# Patient Record
Sex: Female | Born: 1985 | Race: Asian | Hispanic: No | Marital: Single | Smoking: Never smoker
Health system: Southern US, Community
[De-identification: ages and names within clinical notes are randomized; demographics above are authoritative.]

## PROBLEM LIST (undated history)

## (undated) ENCOUNTER — Inpatient Hospital Stay (HOSPITAL_COMMUNITY): Payer: Self-pay

---

## 2004-06-09 ENCOUNTER — Ambulatory Visit (HOSPITAL_COMMUNITY): Admission: RE | Admit: 2004-06-09 | Discharge: 2004-06-09 | Payer: Self-pay | Admitting: *Deleted

## 2004-07-12 ENCOUNTER — Ambulatory Visit: Payer: Self-pay | Admitting: Family Medicine

## 2004-07-12 ENCOUNTER — Ambulatory Visit: Payer: Self-pay | Admitting: Neonatology

## 2004-07-12 ENCOUNTER — Ambulatory Visit: Payer: Self-pay | Admitting: Obstetrics and Gynecology

## 2004-07-12 ENCOUNTER — Inpatient Hospital Stay (HOSPITAL_COMMUNITY): Admission: AD | Admit: 2004-07-12 | Discharge: 2004-07-23 | Payer: Self-pay | Admitting: Obstetrics & Gynecology

## 2004-07-12 ENCOUNTER — Ambulatory Visit: Payer: Self-pay | Admitting: Obstetrics & Gynecology

## 2004-07-19 ENCOUNTER — Encounter (INDEPENDENT_AMBULATORY_CARE_PROVIDER_SITE_OTHER): Payer: Self-pay | Admitting: Specialist

## 2004-07-24 ENCOUNTER — Encounter: Admission: RE | Admit: 2004-07-24 | Discharge: 2004-07-31 | Payer: Self-pay | Admitting: Obstetrics & Gynecology

## 2007-09-25 ENCOUNTER — Ambulatory Visit (HOSPITAL_COMMUNITY): Admission: RE | Admit: 2007-09-25 | Discharge: 2007-09-25 | Payer: Self-pay | Admitting: Family Medicine

## 2007-10-02 ENCOUNTER — Ambulatory Visit (HOSPITAL_COMMUNITY): Admission: RE | Admit: 2007-10-02 | Discharge: 2007-10-02 | Payer: Self-pay | Admitting: Family Medicine

## 2007-10-09 ENCOUNTER — Ambulatory Visit: Payer: Self-pay | Admitting: Family Medicine

## 2007-10-23 ENCOUNTER — Ambulatory Visit: Payer: Self-pay | Admitting: Obstetrics & Gynecology

## 2007-10-30 ENCOUNTER — Ambulatory Visit: Payer: Self-pay | Admitting: Obstetrics & Gynecology

## 2007-10-30 ENCOUNTER — Ambulatory Visit (HOSPITAL_COMMUNITY): Admission: RE | Admit: 2007-10-30 | Discharge: 2007-10-30 | Payer: Self-pay | Admitting: Family Medicine

## 2007-11-06 ENCOUNTER — Ambulatory Visit: Payer: Self-pay | Admitting: Family Medicine

## 2007-11-13 ENCOUNTER — Ambulatory Visit: Payer: Self-pay | Admitting: Family Medicine

## 2007-11-13 ENCOUNTER — Ambulatory Visit (HOSPITAL_COMMUNITY): Admission: RE | Admit: 2007-11-13 | Discharge: 2007-11-13 | Payer: Self-pay

## 2007-11-20 ENCOUNTER — Ambulatory Visit: Payer: Self-pay | Admitting: Family Medicine

## 2007-11-27 ENCOUNTER — Ambulatory Visit: Payer: Self-pay | Admitting: Family Medicine

## 2007-11-28 ENCOUNTER — Ambulatory Visit (HOSPITAL_COMMUNITY): Admission: RE | Admit: 2007-11-28 | Discharge: 2007-11-28 | Payer: Self-pay | Admitting: Family Medicine

## 2007-12-04 ENCOUNTER — Ambulatory Visit: Payer: Self-pay | Admitting: *Deleted

## 2007-12-11 ENCOUNTER — Ambulatory Visit (HOSPITAL_COMMUNITY): Admission: RE | Admit: 2007-12-11 | Discharge: 2007-12-11 | Payer: Self-pay | Admitting: Family Medicine

## 2007-12-11 ENCOUNTER — Ambulatory Visit: Payer: Self-pay | Admitting: Family Medicine

## 2007-12-18 ENCOUNTER — Ambulatory Visit: Payer: Self-pay | Admitting: Obstetrics & Gynecology

## 2007-12-25 ENCOUNTER — Ambulatory Visit: Payer: Self-pay | Admitting: Obstetrics & Gynecology

## 2008-01-01 ENCOUNTER — Ambulatory Visit: Payer: Self-pay | Admitting: Family Medicine

## 2008-01-08 ENCOUNTER — Ambulatory Visit: Payer: Self-pay | Admitting: Obstetrics & Gynecology

## 2008-01-15 ENCOUNTER — Ambulatory Visit: Payer: Self-pay | Admitting: Obstetrics & Gynecology

## 2008-01-22 ENCOUNTER — Ambulatory Visit (HOSPITAL_COMMUNITY): Admission: RE | Admit: 2008-01-22 | Discharge: 2008-01-22 | Payer: Self-pay | Admitting: Family Medicine

## 2008-01-22 ENCOUNTER — Ambulatory Visit: Payer: Self-pay | Admitting: Family Medicine

## 2008-01-29 ENCOUNTER — Ambulatory Visit: Payer: Self-pay | Admitting: Obstetrics & Gynecology

## 2008-02-05 ENCOUNTER — Ambulatory Visit: Payer: Self-pay | Admitting: Family Medicine

## 2008-02-12 ENCOUNTER — Ambulatory Visit: Payer: Self-pay | Admitting: Obstetrics & Gynecology

## 2008-02-19 ENCOUNTER — Ambulatory Visit: Payer: Self-pay | Admitting: Obstetrics & Gynecology

## 2008-02-26 ENCOUNTER — Ambulatory Visit: Payer: Self-pay | Admitting: Obstetrics & Gynecology

## 2008-03-04 ENCOUNTER — Ambulatory Visit: Payer: Self-pay | Admitting: Obstetrics & Gynecology

## 2008-03-11 ENCOUNTER — Ambulatory Visit: Payer: Self-pay | Admitting: Obstetrics & Gynecology

## 2008-03-18 ENCOUNTER — Ambulatory Visit: Payer: Self-pay | Admitting: Obstetrics & Gynecology

## 2008-03-28 ENCOUNTER — Ambulatory Visit: Payer: Self-pay | Admitting: Obstetrics and Gynecology

## 2008-03-28 ENCOUNTER — Inpatient Hospital Stay (HOSPITAL_COMMUNITY): Admission: AD | Admit: 2008-03-28 | Discharge: 2008-03-30 | Payer: Self-pay | Admitting: Obstetrics and Gynecology

## 2009-04-23 IMAGING — US US OB COMP LESS 14 WK
1 series · 11 of 11 positions shown · non-contrast
Comparison: none

OBSTETRICAL ULTRASOUND:
 This ultrasound was performed in The [HOSPITAL], and the [facility] report will be stored to [REDACTED] PACS.

[Series 1: us ob comp less 14 wk · 11 of 11 slices shown]
[im 1/11]
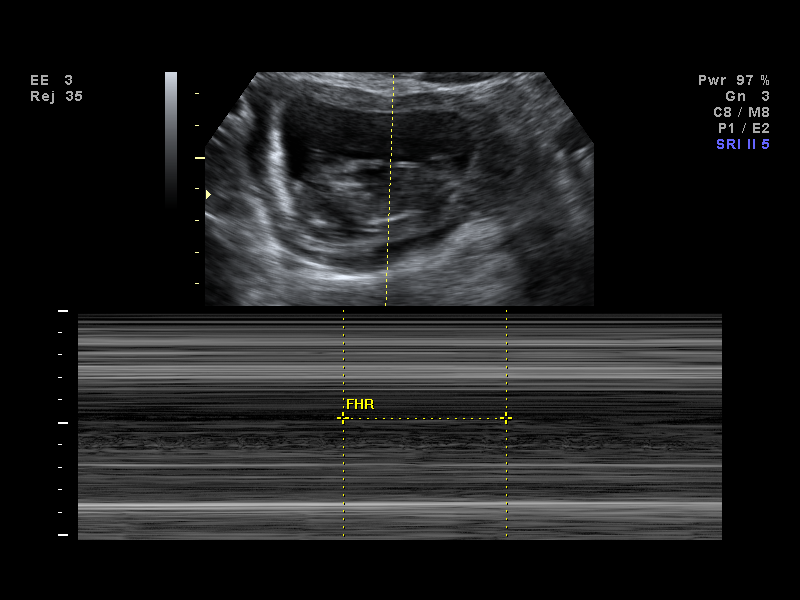
[im 2/11]
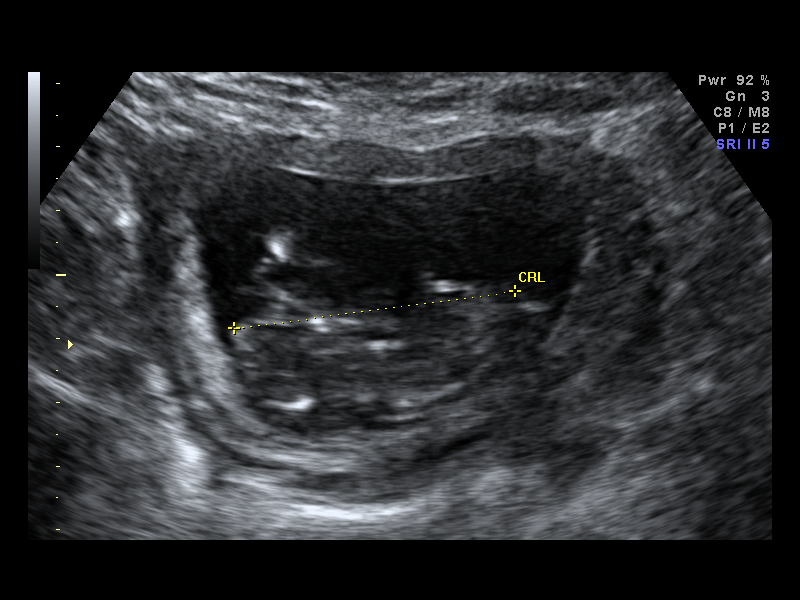
[im 3/11]
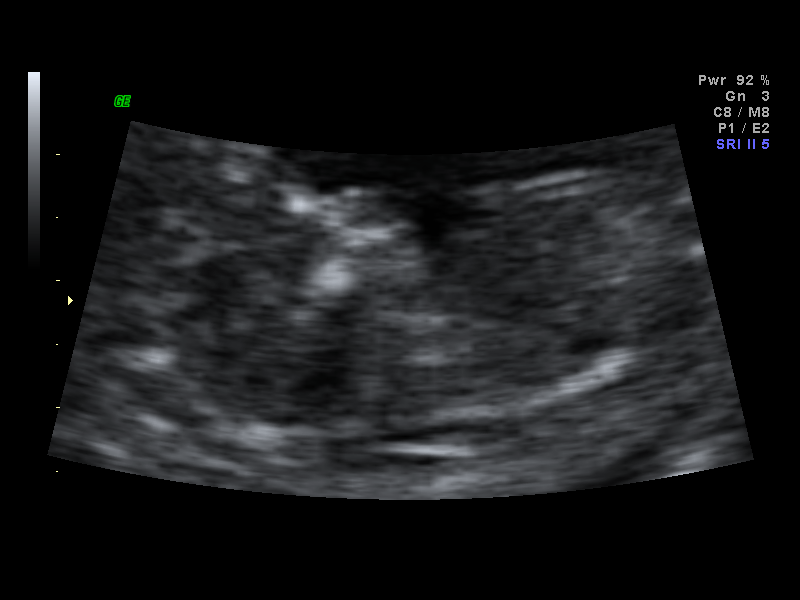
[im 4/11]
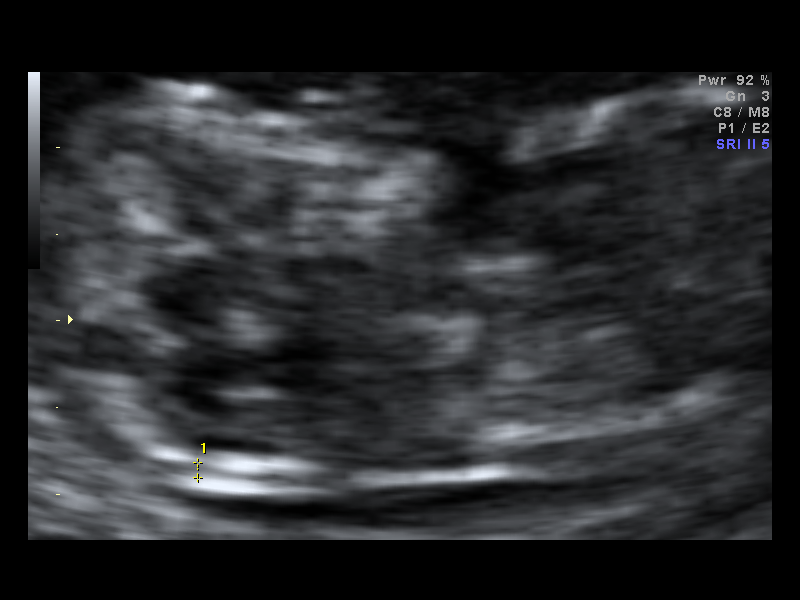
[im 5/11]
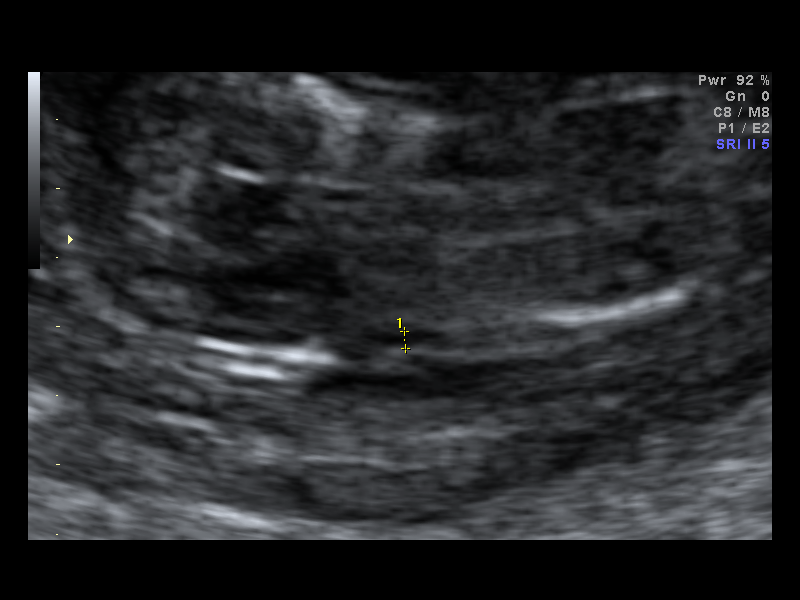
[im 6/11]
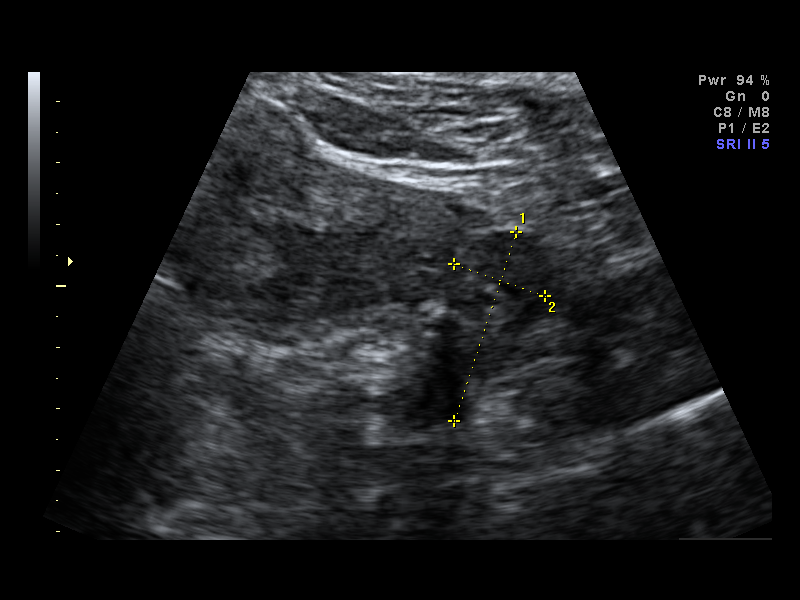
[im 7/11]
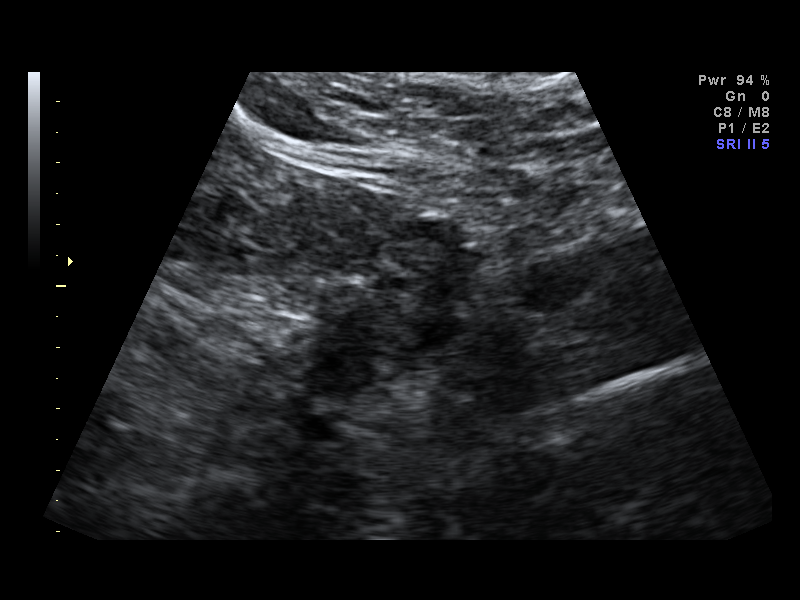
[im 8/11]
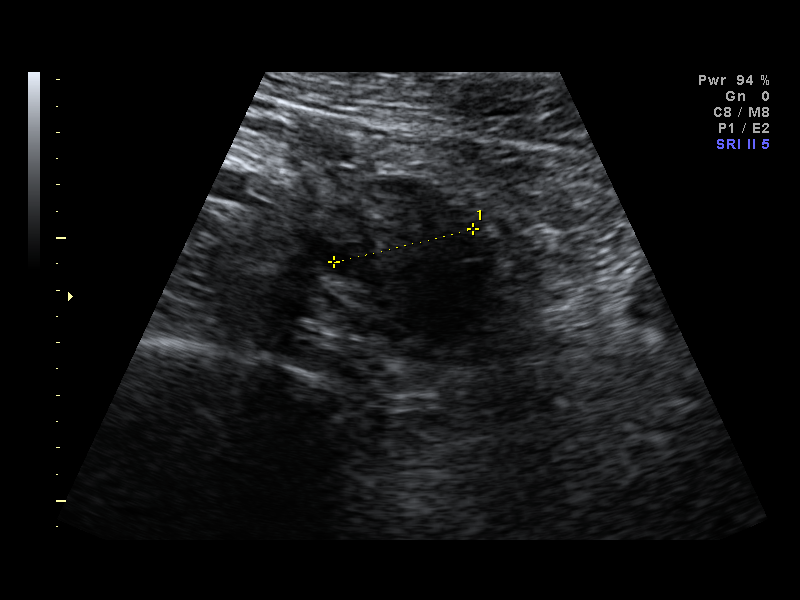
[im 9/11]
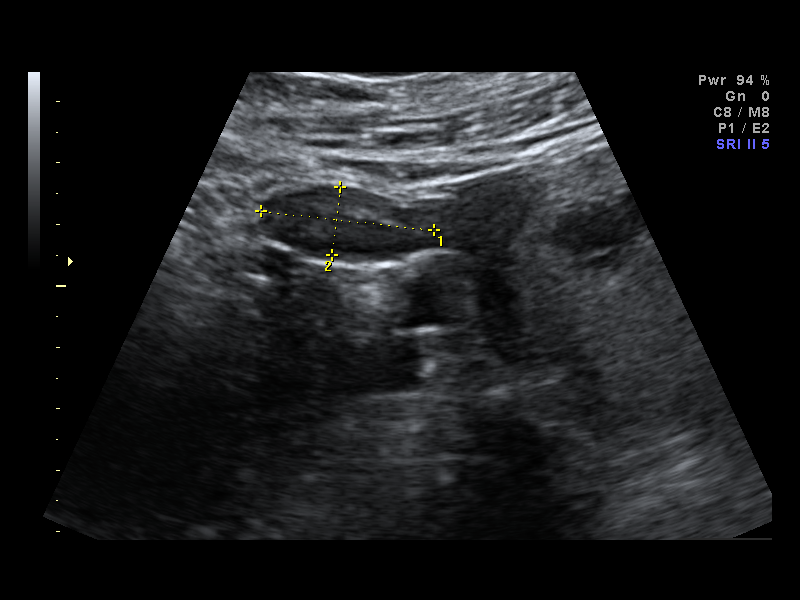
[im 10/11]
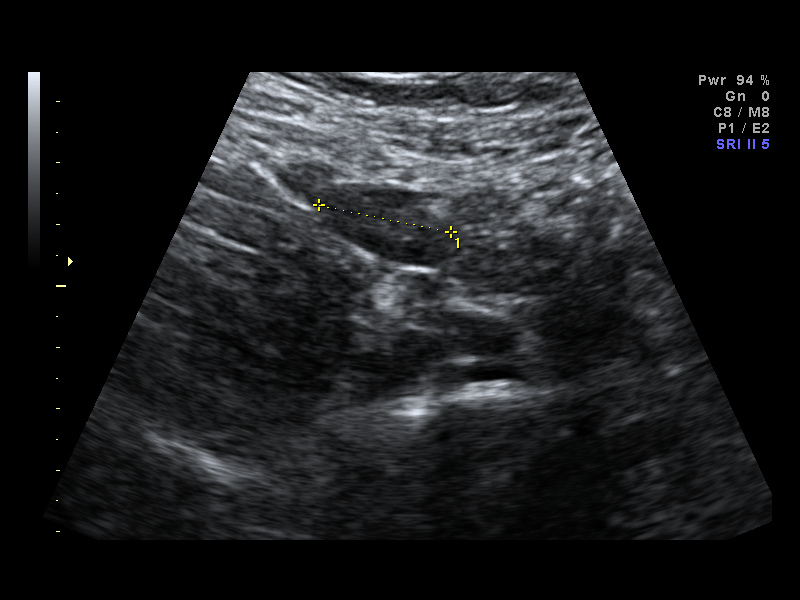
[im 11/11]
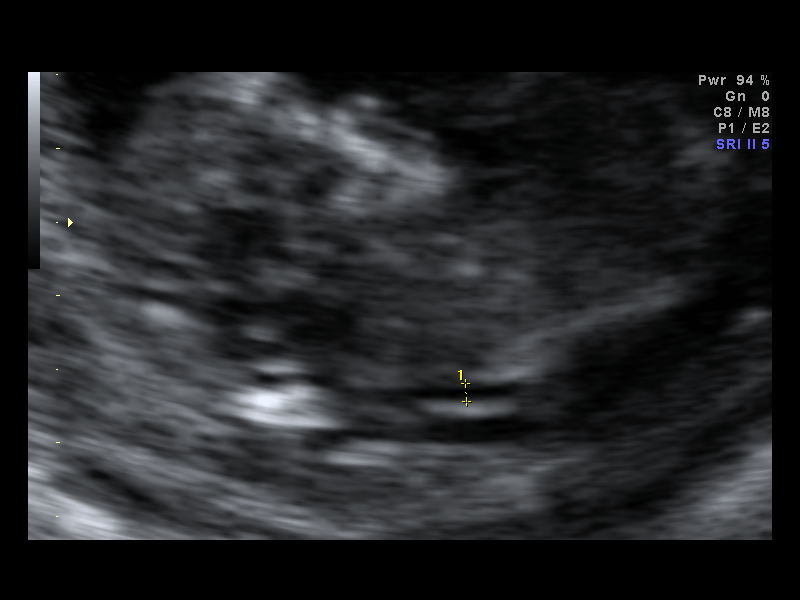

[11 of 11 positions shown; findings below may reference images not displayed]

IMPRESSION: [facility] has also been faxed to the ordering physician.

## 2009-06-26 IMAGING — US US OB TRANSVAGINAL
1 series · 6 of 6 positions shown · non-contrast
Comparison: none

OBSTETRICAL ULTRASOUND:
 This ultrasound exam was performed in the [HOSPITAL] Ultrasound Department.  The OB US report was generated in the AS system, and faxed to the ordering physician.  This report is also available in [REDACTED] PACS.

[Series 1: us ob transvaginal · 6 of 6 slices shown]
[im 1/6]
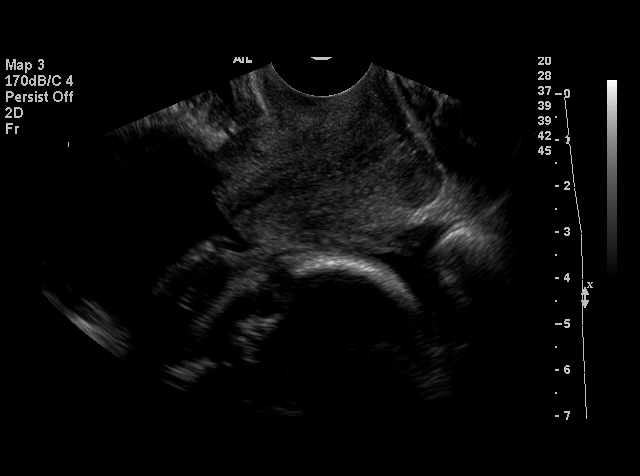
[im 2/6]
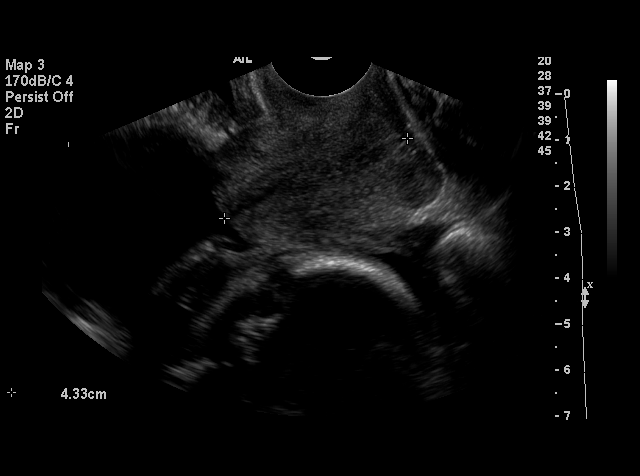
[im 3/6]
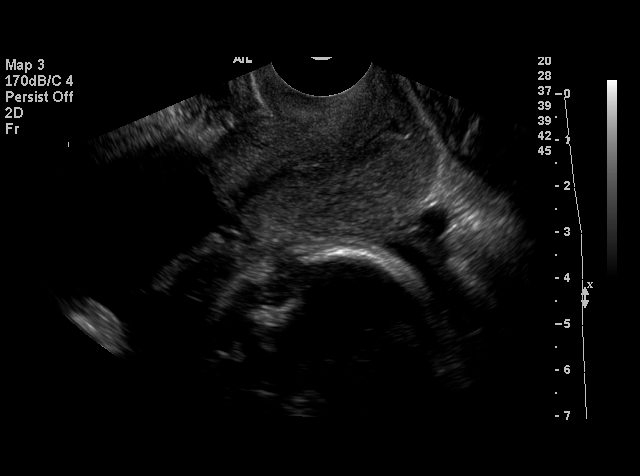
[im 4/6]
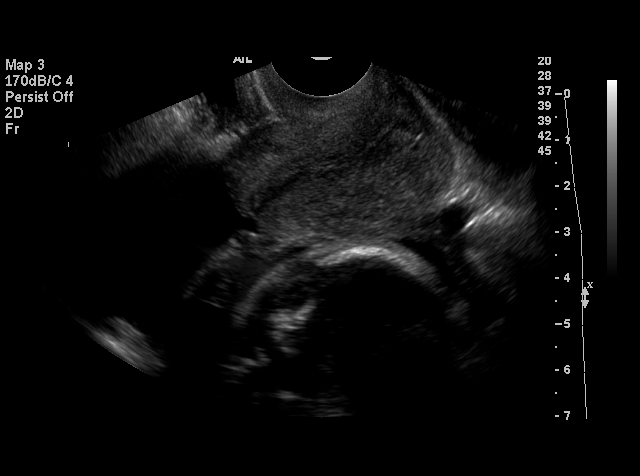
[im 5/6]
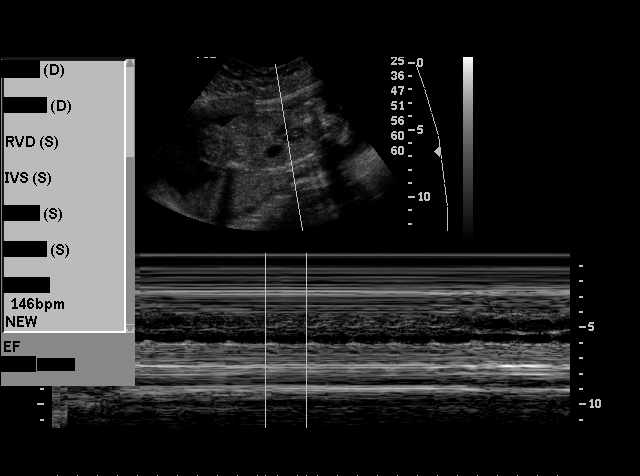
[im 6/6]
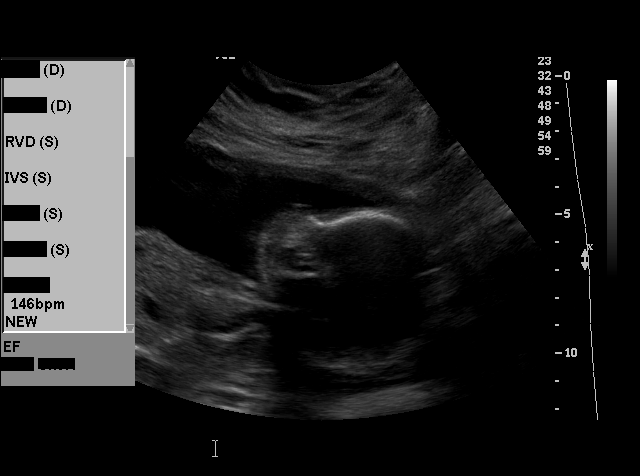

[6 of 6 positions shown; findings below may reference images not displayed]

IMPRESSION: See [facility] Obstetric US report.

## 2010-11-10 NOTE — Discharge Summary (Signed)
NAMEHATLEY, HENEGAR                   ACCOUNT NO.:  [id number]   MEDICAL RECORD NO.:  [id number]          PATIENT TYPE:  INP   LOCATION:  9137                          FACILITY:  [facility]   PHYSICIAN:  Lesly Dukes, M.D. DATE OF BIRTH:  May 12, 1986   DATE OF ADMISSION:  07/12/2004  DATE OF DISCHARGE:  07/23/2004                                 DISCHARGE SUMMARY   ADMISSION DIAGNOSES:  72.  A 25 year old G1 at 34 and 2 with cervical effacement to 80% with an      ultrasound showing 0 cm cervical length.  2.  Reassuring fetal heart tracing by Doppler.   DISCHARGE DIAGNOSES:  33.  A 25 year old G1 P0-1-0-1, postoperative day #3 status post lower      transverse C-section at 63 and 3 weeks for active labor with a baby in a      transverse lie.  2.  Viable infant girl with Apgars 5 and 7.   DISCHARGE MEDICATIONS:  1.  Percocet 5/325, 1 p.o. q.4-6h. p.r.n., #30, no refills.  2.  Ibuprofen 600 mg 1 p.o. q.6h. p.r.n.  3.  Depo-Provera 150 mg IM every 3 months.  4.  Prenatal vitamin 1 p.o. daily.   ADMISSION HISTORY:  Ms. Vitelli was admitted from [facility] with cervical  effacement and no contractions noted.  She was placed on bed rest, given  betamethasone and magnesium.   HOSPITAL COURSE:  Her antepartum course was extensive.  On hospital day #2,  she was started on ibuprofen, and a NICU consult was performed.  She then  stabilized for the next 3 days.  On hospital day #5, she began having some  cramping but with no cervical change.  On hospital day #7, she was noted to  have cervical change to 3-4 cm dilated with membranes at the os, and the  fetus was vertex on ultrasound.  On the next hospital day, she went into  active labor on magnesium, and the fetus was noted to have flipped to a  transverse lie, and she was taken to the operating room for a C-section.  Please see that operative note.   Her postoperative course was uncomplicated.  Her postoperative hemoglobin  was 9.4, down from  10.9.  She had no active bleeding and was stable on day  of discharge.   INSTRUCTIONS GIVEN TO PATIENT:  The patient was told of the above medical  regimen.  She will be given a breast pump to facilitate breast feeding in  her NICU infant.  She was also told to follow up at [facility] in 6  weeks.      LC/MEDQ  D:  07/23/2004  T:  07/23/2004  Job:  [id number]   cc:   [facility] [employer]

## 2010-11-10 NOTE — Op Note (Signed)
Brittany Perez, Brittany Perez                   ACCOUNT NO.:  [id number]   MEDICAL RECORD NO.:  [id number]          PATIENT TYPE:  INP   LOCATION:  9137                          FACILITY:  [facility]   PHYSICIAN:  Lesly Dukes, M.D. DATE OF BIRTH:  02/25/1986   DATE OF PROCEDURE:  07/20/2004  DATE OF DISCHARGE:                                 OPERATIVE REPORT   PREOPERATIVE DIAGNOSIS:  A 25 year old, para 0 female at 25 weeks and 3 days  estimated gestational age with footling breech presentation and active  labor.   POSTOPERATIVE DIAGNOSIS:  A 25 year old, para 0 female at 68 weeks and 3  days estimated gestational age with footling breech presentation and active  labor.   PROCEDURE:  Primary low flap transverse cesarean section.   SURGEON:  Dr. Elsie Lincoln   ASSISTANT:  For the first half of the procedure was Dr. Elinor Dodge, and the  assistant for the second half of the procedure was Dr. Tinnie Gens.   ANESTHESIA:  Spinal.   ESTIMATED BLOOD LOSS:  Less than 800.   COMPLICATIONS:  None.   PATHOLOGY:  Placenta.   FINDINGS:  A viable female infant; Apgars 5 at one minute and 7 at five  minutes; footling breech presentation; clear fluid; anterior placenta with  three-vessel cord; arterial cord pH equal 7.33; grossly normal uterus,  ovaries, and fallopian tubes; NICU team at delivery.   DESCRIPTION OF PROCEDURE:  After informed consent was obtained, the patient  was taken to the operating room where spinal anesthesia was administered.  The patient was then placed in supine position with a leftward tilt.  The  patient was prepared and draped in normal sterile fashion.  Foley was  already in the bladder.  A Pfannenstiel skin incision was made with the  scalpel, and this incision was carried down to the underlying layer of  fascia.  The fascia was incised in the midline and extended bilaterally with  the Mayo scissors.  The superior and inferior aspects of the fascial  incision were grasped  with Kocher clamps, tented up, and dissected off  sharply and bluntly from the underlying layers of rectus muscle.  Rectus  muscles were separated in the midline.  Peritoneum was identified, tented  up, and entered sharply with the Metzenbaum scissors.  This incision was  extended both superiorly and inferiorly with good visualization of the  bladder.  The bladder blade was inserted.  The vesicouterine peritoneum was  identified, tented up, entered sharply with the Metzenbaum scissors.  This  incision was extended bilaterally, and the bladder flap was created  digitally.  The bladder blade we reinserted.  The uterine incision was made  in transverse fashion in the lower uterine segment with a scalpel, and this  incision was extended bilaterally with bandage scissors.  We did have to go  through the placenta in order to reach the baby.  The baby delivered  footling breech.  The nose and mouth were suctioned.  The cord was clamped  and cut, and the baby handed off to the awaiting pediatricians.  Cord blood  was sent for type and screening, and the cord arterial blood gas was also  sent.  The placenta was delivered spontaneously and sent to pathology.  The  uterus was exteriorized and cleared of all clot and debris.  The uterine  incision was closed with 0 Vicryl in a running locked fashion.  Excellent  hemostasis was noted.  The uterus was returned to the abdomen and noted to  be hemostatic off tension.  The gutters were cleared of all clots and  debris, and irrigation of the peritoneal cavity was performed.  Good  hemostasis was noted one last time.  The fascia was closed with 0 Vicryl in  a running fashion.  The subcutaneous tissue was copiously irrigated.  The  skin was then closed with staples.  The patient tolerated the procedure  well.  Sponge, lap, instrument, and needle count were correct x 2.  The  patient went to the recovery room in stable condition.      KHL/MEDQ  D:   07/20/2004  T:  07/20/2004  Job:  [id number]

## 2011-03-20 LAB — POCT URINALYSIS DIP (DEVICE)
Bilirubin Urine: NEGATIVE
Bilirubin Urine: NEGATIVE
Glucose, UA: NEGATIVE
Glucose, UA: NEGATIVE
Ketones, ur: NEGATIVE
Ketones, ur: NEGATIVE
Nitrite: NEGATIVE
Nitrite: NEGATIVE
Operator id: 200901
Operator id: 297281
Protein, ur: NEGATIVE
Protein, ur: NEGATIVE
Specific Gravity, Urine: 1.02
Specific Gravity, Urine: 1.02
Urobilinogen, UA: 0.2
Urobilinogen, UA: 0.2
pH: 6.5
pH: 7

## 2011-03-21 LAB — POCT URINALYSIS DIP (DEVICE)
Bilirubin Urine: NEGATIVE
Glucose, UA: NEGATIVE
Hgb urine dipstick: NEGATIVE
Ketones, ur: NEGATIVE
Nitrite: NEGATIVE
Operator id: 297281
Protein, ur: NEGATIVE
Specific Gravity, Urine: 1.02
Urobilinogen, UA: 1
pH: 7

## 2011-03-22 LAB — POCT URINALYSIS DIP (DEVICE)
Bilirubin Urine: NEGATIVE
Bilirubin Urine: NEGATIVE
Bilirubin Urine: NEGATIVE
Bilirubin Urine: NEGATIVE
Glucose, UA: NEGATIVE
Glucose, UA: NEGATIVE
Glucose, UA: NEGATIVE
Glucose, UA: NEGATIVE
Hgb urine dipstick: NEGATIVE
Ketones, ur: NEGATIVE
Ketones, ur: NEGATIVE
Ketones, ur: NEGATIVE
Nitrite: NEGATIVE
Nitrite: NEGATIVE
Nitrite: NEGATIVE
Nitrite: NEGATIVE
Operator id: 15968
Operator id: 297281
Operator id: 297281
Operator id: 148111
Protein, ur: NEGATIVE
Protein, ur: NEGATIVE
Protein, ur: NEGATIVE
Protein, ur: 30 — AB
Specific Gravity, Urine: 1.015
Specific Gravity, Urine: 1.015
Specific Gravity, Urine: 1.015
Specific Gravity, Urine: 1.02
Urobilinogen, UA: 0.2
Urobilinogen, UA: 0.2
Urobilinogen, UA: 0.2
Urobilinogen, UA: 0.2
pH: 7
pH: 7
pH: 7
pH: 6.5

## 2011-03-23 LAB — POCT URINALYSIS DIP (DEVICE)
Bilirubin Urine: NEGATIVE
Bilirubin Urine: NEGATIVE
Bilirubin Urine: NEGATIVE
Bilirubin Urine: NEGATIVE
Glucose, UA: NEGATIVE
Glucose, UA: NEGATIVE
Glucose, UA: NEGATIVE
Glucose, UA: NEGATIVE
Hgb urine dipstick: NEGATIVE
Hgb urine dipstick: NEGATIVE
Hgb urine dipstick: NEGATIVE
Ketones, ur: NEGATIVE
Ketones, ur: NEGATIVE
Ketones, ur: NEGATIVE
Ketones, ur: NEGATIVE
Nitrite: NEGATIVE
Nitrite: NEGATIVE
Nitrite: NEGATIVE
Nitrite: NEGATIVE
Operator id: 148111
Operator id: 297281
Operator id: 159681
Operator id: 297281
Protein, ur: NEGATIVE
Protein, ur: NEGATIVE
Protein, ur: NEGATIVE
Protein, ur: NEGATIVE
Specific Gravity, Urine: 1.015
Specific Gravity, Urine: 1.015
Specific Gravity, Urine: 1.015
Specific Gravity, Urine: 1.015
Urobilinogen, UA: 0.2
Urobilinogen, UA: 0.2
Urobilinogen, UA: 0.2
Urobilinogen, UA: 0.2
pH: 7
pH: 7.5
pH: 7
pH: 7.5

## 2011-03-26 LAB — POCT URINALYSIS DIP (DEVICE)
Bilirubin Urine: NEGATIVE
Bilirubin Urine: NEGATIVE
Glucose, UA: NEGATIVE
Glucose, UA: NEGATIVE
Hgb urine dipstick: NEGATIVE
Ketones, ur: NEGATIVE
Ketones, ur: NEGATIVE
Nitrite: NEGATIVE
Nitrite: NEGATIVE
Operator id: 159681
Operator id: 297281
Protein, ur: NEGATIVE
Protein, ur: NEGATIVE
Specific Gravity, Urine: 1.015
Specific Gravity, Urine: 1.015
Urobilinogen, UA: 0.2
Urobilinogen, UA: 0.2
pH: 7
pH: 7.5

## 2011-03-26 LAB — CBC
HCT: 36.6
Hemoglobin: 12.1
MCHC: 33.1
MCV: 81.6
Platelets: 250
RBC: 4.49
RDW: 14.6
WBC: 15.3 — ABNORMAL HIGH

## 2011-03-26 LAB — RPR: RPR Ser Ql: NONREACTIVE

## 2011-03-28 LAB — POCT URINALYSIS DIP (DEVICE)
Bilirubin Urine: NEGATIVE
Bilirubin Urine: NEGATIVE
Glucose, UA: NEGATIVE
Glucose, UA: NEGATIVE
Hgb urine dipstick: NEGATIVE
Ketones, ur: NEGATIVE
Ketones, ur: NEGATIVE
Nitrite: NEGATIVE
Nitrite: NEGATIVE
Operator id: 200901
Operator id: 297281
Protein, ur: NEGATIVE
Protein, ur: NEGATIVE
Specific Gravity, Urine: 1.015
Specific Gravity, Urine: 1.015
Urobilinogen, UA: 0.2
Urobilinogen, UA: 0.2
pH: 7
pH: 7

## 2012-12-10 ENCOUNTER — Ambulatory Visit: Payer: Self-pay | Admitting: Emergency Medicine

## 2012-12-10 VITALS — BP 110/80 | HR 86 | Temp 98.0°F | Resp 16 | Ht 60.0 in | Wt 163.0 lb

## 2012-12-10 DIAGNOSIS — M719 Bursopathy, unspecified: Secondary | ICD-10-CM

## 2012-12-10 DIAGNOSIS — M7552 Bursitis of left shoulder: Secondary | ICD-10-CM

## 2012-12-10 MED ORDER — TRAMADOL HCL 50 MG PO TABS: 50.0000 mg | ORAL_TABLET | Freq: Three times a day (TID) | ORAL | Status: DC | PRN

## 2012-12-10 MED ORDER — NAPROXEN SODIUM 550 MG PO TABS: 550.0000 mg | ORAL_TABLET | Freq: Two times a day (BID) | ORAL | Status: DC

## 2012-12-10 NOTE — Progress Notes (Signed)
[facility] [address], [place] [address] [phone]  Date:  12/10/2012   Name:  Brittany Perez   DOB:  1985-12-20   MRN:  [id number]  PCP:  No primary provider on file.    Chief Complaint: Arm Pain   History of Present Illness:  Brittany Perez is a 27 y.o. very pleasant female patient who presents with the following:  No history of injury.  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  Has no other complaints. No improvement with over the counter medications or other home remedies. Denies other complaint or health concern today.   There are no active problems to display for this patient.   History reviewed. No pertinent past medical history.  Past Surgical History  Procedure Laterality Date  . Cesarean section      History  Substance Use Topics  . Smoking status: Never Smoker   . Smokeless tobacco: Not on file  . Alcohol Use: No    History reviewed. No pertinent family history.  No Known Allergies  Medication list has been reviewed and updated.  No current outpatient prescriptions on file prior to visit.   No current facility-administered medications on file prior to visit.    Review of Systems:  As per HPI, otherwise negative.    Physical Examination: Filed Vitals:   12/10/12 2018  BP: 110/80  Pulse: 86  Temp: 98 F (36.7 C)  Resp: 16   Filed Vitals:   12/10/12 2018  Height: 5' (1.524 m)  Weight: 163 lb (73.936 kg)   Body mass index is 31.83 kg/(m^2). Ideal Body Weight: Weight in (lb) to have BMI = 25: 127.7   GEN: WDWN, NAD, Non-toxic, Alert & Oriented x 3 HEENT: Atraumatic, Normocephalic.  Ears and Nose: No external deformity. EXTR: No clubbing/cyanosis/edema NEURO: Normal gait.  PSYCH: Normally interactive. Conversant. Not depressed or anxious appearing.  Calm demeanor.  LEFT shoulder:  Tender anterior shoulder bursa.  Full active and passive ROM. No crepitus.  No  erythema.  Assessment and Plan: Shoulder bursitis Anaprox Tramadol   Signed,  Phillips Odor, MD

## 2013-10-28 ENCOUNTER — Encounter: Payer: Self-pay | Admitting: *Deleted

## 2013-11-12 ENCOUNTER — Ambulatory Visit (INDEPENDENT_AMBULATORY_CARE_PROVIDER_SITE_OTHER): Payer: Medicaid Other | Admitting: Family

## 2013-11-12 ENCOUNTER — Encounter: Payer: Self-pay | Admitting: Family

## 2013-11-12 VITALS — BP 137/82 | HR 103 | Temp 98.2°F | Ht 63.0 in | Wt 171.7 lb

## 2013-11-12 DIAGNOSIS — Z98891 History of uterine scar from previous surgery: Secondary | ICD-10-CM

## 2013-11-12 DIAGNOSIS — O0990 Supervision of high risk pregnancy, unspecified, unspecified trimester: Secondary | ICD-10-CM | POA: Insufficient documentation

## 2013-11-12 DIAGNOSIS — Z9889 Other specified postprocedural states: Secondary | ICD-10-CM

## 2013-11-12 DIAGNOSIS — O09219 Supervision of pregnancy with history of pre-term labor, unspecified trimester: Secondary | ICD-10-CM

## 2013-11-12 DIAGNOSIS — Z331 Pregnant state, incidental: Secondary | ICD-10-CM

## 2013-11-12 DIAGNOSIS — O09899 Supervision of other high risk pregnancies, unspecified trimester: Secondary | ICD-10-CM

## 2013-11-12 DIAGNOSIS — O34219 Maternal care for unspecified type scar from previous cesarean delivery: Secondary | ICD-10-CM | POA: Insufficient documentation

## 2013-11-12 DIAGNOSIS — Z3490 Encounter for supervision of normal pregnancy, unspecified, unspecified trimester: Secondary | ICD-10-CM

## 2013-11-12 LAB — POCT URINALYSIS DIP (DEVICE)
BILIRUBIN URINE: NEGATIVE
Glucose, UA: NEGATIVE mg/dL
KETONES UR: NEGATIVE mg/dL
Nitrite: NEGATIVE
PH: 8 (ref 5.0–8.0)
Protein, ur: NEGATIVE mg/dL
Specific Gravity, Urine: 1.015 (ref 1.005–1.030)
Urobilinogen, UA: 0.2 mg/dL (ref 0.0–1.0)

## 2013-11-12 NOTE — Progress Notes (Signed)
Reports pain with walking or standing; had lots of cramps past month

## 2013-11-12 NOTE — Progress Notes (Signed)
Subjective:    Brittany Perez is a [id number] 6972w4d being seen today for her first obstetrical visit.  Her obstetrical history is significant for history of preterm labor starting at 24 wks with subsequent delivery at 25 wks by csection due to breech presentation.  2nd pregnancy received 17-p and delivered at 39 wks via VBAC.  Marland Kitchen. Patient does intend to breast feed. Pregnancy history fully reviewed.  Patient reports nausea, no bleeding and no cramping.  Filed Vitals:   11/12/13 1017 11/12/13 1019  BP: 137/82   Pulse: 103   Temp: 98.2 F (36.8 C)   Height:  5\' 3"  (1.6 m)  Weight: 171 lb 11.2 oz (77.883 kg)     HISTORY: OB History  Gravida Para Term Preterm AB SAB TAB Ectopic Multiple Living  3 2 1 1  0 0 0 0 0 2    # Outcome Date GA Lbr Len/2nd Weight Sex Delivery Anes PTL Lv  3 CUR           2 TRM 03/28/08 6626w3d  6 lb (2.722 kg) F VBAC None  Y     Comments: had 17p injections;   1 PRE 07/20/04 776w3d  1 lb 6 oz (0.624 kg) F CS Spinal  Y     Comments: Admitted 80% effaced, closed > progessed and began dilating > csection for breech     Past Medical History  Diagnosis Date  . Preterm labor    Past Surgical History  Procedure Laterality Date  . Cesarean section     Family History  Problem Relation Age of Onset  . Diabetes Mother      Exam   Exam   BP 137/82  Pulse 103  Temp(Src) 98.2 F (36.8 C)  Ht 5\' 3"  (1.6 m)  Wt 171 lb 11.2 oz (77.883 kg)  BMI 30.42 kg/m2  LMP 08/23/2013 Uterine Size: size greater than dates.    Pelvic Exam:    Perineum: No Hemorrhoids, Normal Perineum   Vulva: normal   Vagina:  normal mucosa, normal discharge, no palpable nodules   pH: Not done   Cervix: no bleeding following Pap, no cervical motion tenderness and no lesions   Adnexa: normal adnexa and no mass, fullness, tenderness   Bony Pelvis: Adequate  System: Breast:  No nipple retraction or dimpling, No nipple discharge or bleeding, No axillary or supraclavicular adenopathy, Normal to  palpation without dominant masses   Skin: normal coloration and turgor, no rashes    Neurologic: negative   Extremities: normal strength, tone, and muscle mass   HEENT neck supple with midline trachea and thyroid without masses   Mouth/Teeth mucous membranes moist, pharynx normal without lesions   Neck supple and no masses   Cardiovascular: regular rate and rhythm, no murmurs or gallops   Respiratory:  appears well, vitals normal, no respiratory distress, acyanotic, normal RR, neck free of mass or lymphadenopathy, chest clear, no wheezing, crepitations, rhonchi, normal symmetric air entry   Abdomen: soft, non-tender; bowel sounds normal; no masses,  no organomegaly   Urinary: urethral meatus normal        Assessment:    Pregnancy:  28 yo G3P1102 at 7372w4d wks IUP by reported certain LMP  Patient Active Problem List   Diagnosis Date Noted  . Supervision of high-risk pregnancy 11/12/2013  . H/O preterm delivery, currently pregnant 11/12/2013  . History of C-section 11/12/2013        Plan:     Patient left without initial labs drawn.  Please obtain at next visit.   Prenatal vitamins. Problem list reviewed and updated. Genetic Screening discussed First Screen: ordered. Begin 17-p at 14 wks, application completed.   Follow up in 3 weeks.   Eino FarberWalidah Paul HalfN Muhammad 11/12/2013

## 2013-11-12 NOTE — Progress Notes (Signed)
Integrated Screen with MFM scheduled 11/23/13 at 1130 am. 17P application filled out.

## 2013-11-12 NOTE — Addendum Note (Signed)
Addended by: Louanna RawAMPBELL, Antigone Crowell M on: 11/12/2013 04:43 PM   Modules accepted: Orders

## 2013-11-14 LAB — PRESCRIPTION MONITORING PROFILE (19 PANEL)
Amphetamine/Meth: NEGATIVE ng/mL
Barbiturate Screen, Urine: NEGATIVE ng/mL
Benzodiazepine Screen, Urine: NEGATIVE ng/mL
Buprenorphine, Urine: NEGATIVE ng/mL
CANNABINOID SCRN UR: NEGATIVE ng/mL
CARISOPRODOL, URINE: NEGATIVE ng/mL
COCAINE METABOLITES: NEGATIVE ng/mL
Creatinine, Urine: 149.58 mg/dL (ref >20.0)
Fentanyl, Ur: NEGATIVE ng/mL
MDMA URINE: NEGATIVE ng/mL
MEPERIDINE UR: NEGATIVE ng/mL
Methadone Screen, Urine: NEGATIVE ng/mL
Methaqualone: NEGATIVE ng/mL
Nitrites, Initial: NEGATIVE ug/mL
OXYCODONE SCRN UR: NEGATIVE ng/mL
Opiate Screen, Urine: NEGATIVE ng/mL
Phencyclidine, Ur: NEGATIVE ng/mL
Propoxyphene: NEGATIVE ng/mL
Tapentadol, urine: NEGATIVE ng/mL
Tramadol Scrn, Ur: NEGATIVE ng/mL
Zolpidem, Urine: NEGATIVE ng/mL
pH, Initial: 7.8 pH (ref 4.5–8.9)

## 2013-11-16 LAB — CULTURE, OB URINE: Colony Count: >=100000

## 2013-11-23 ENCOUNTER — Ambulatory Visit (HOSPITAL_COMMUNITY)
Admission: RE | Admit: 2013-11-23 | Discharge: 2013-11-23 | Disposition: A | Discharge status: Home / Self Care | Payer: Medicaid Other | Source: Ambulatory Visit | Attending: Family | Admitting: Family

## 2013-11-23 ENCOUNTER — Encounter (HOSPITAL_COMMUNITY): Payer: Self-pay

## 2013-11-23 ENCOUNTER — Other Ambulatory Visit: Payer: Self-pay

## 2013-11-23 VITALS — BP 131/72 | HR 100 | Wt 171.2 lb

## 2013-11-23 DIAGNOSIS — Z3689 Encounter for other specified antenatal screening: Secondary | ICD-10-CM | POA: Insufficient documentation

## 2013-11-23 DIAGNOSIS — O09899 Supervision of other high risk pregnancies, unspecified trimester: Secondary | ICD-10-CM | POA: Insufficient documentation

## 2013-11-23 DIAGNOSIS — O0990 Supervision of high risk pregnancy, unspecified, unspecified trimester: Secondary | ICD-10-CM

## 2013-11-23 DIAGNOSIS — Z98891 History of uterine scar from previous surgery: Secondary | ICD-10-CM

## 2013-11-23 DIAGNOSIS — O09219 Supervision of pregnancy with history of pre-term labor, unspecified trimester: Secondary | ICD-10-CM

## 2013-11-24 ENCOUNTER — Encounter: Payer: Self-pay | Admitting: Family

## 2013-12-03 ENCOUNTER — Ambulatory Visit (INDEPENDENT_AMBULATORY_CARE_PROVIDER_SITE_OTHER): Payer: Medicaid Other | Admitting: Obstetrics & Gynecology

## 2013-12-03 VITALS — BP 121/66 | HR 95 | Temp 97.6°F | Wt 172.5 lb

## 2013-12-03 DIAGNOSIS — O34219 Maternal care for unspecified type scar from previous cesarean delivery: Secondary | ICD-10-CM

## 2013-12-03 DIAGNOSIS — O09219 Supervision of pregnancy with history of pre-term labor, unspecified trimester: Secondary | ICD-10-CM

## 2013-12-03 DIAGNOSIS — R8290 Unspecified abnormal findings in urine: Secondary | ICD-10-CM

## 2013-12-03 DIAGNOSIS — R82998 Other abnormal findings in urine: Secondary | ICD-10-CM

## 2013-12-03 DIAGNOSIS — O0990 Supervision of high risk pregnancy, unspecified, unspecified trimester: Secondary | ICD-10-CM

## 2013-12-03 DIAGNOSIS — Z789 Other specified health status: Secondary | ICD-10-CM

## 2013-12-03 DIAGNOSIS — O09899 Supervision of other high risk pregnancies, unspecified trimester: Secondary | ICD-10-CM

## 2013-12-03 DIAGNOSIS — Z609 Problem related to social environment, unspecified: Secondary | ICD-10-CM

## 2013-12-03 LAB — POCT URINALYSIS DIP (DEVICE)
Bilirubin Urine: NEGATIVE
Glucose, UA: NEGATIVE mg/dL
KETONES UR: NEGATIVE mg/dL
LEUKOCYTES UA: NEGATIVE
NITRITE: NEGATIVE
PROTEIN: NEGATIVE mg/dL
Specific Gravity, Urine: 1.025 (ref 1.005–1.030)
UROBILINOGEN UA: 0.2 mg/dL (ref 0.0–1.0)
pH: 6.5 (ref 5.0–8.0)

## 2013-12-03 NOTE — Progress Notes (Signed)
U/S scheduled January 04, 2014 at 830 am.

## 2013-12-03 NOTE — Progress Notes (Signed)
Reports occasional pelvic discomfort especially while walking.

## 2013-12-03 NOTE — Progress Notes (Signed)
[place] speaking only, interpreter present.  Urine cath done today given that previous urine culture showed Enterococcus resistant to usual antibiotics.  Will follow up repeat urine culture today and manage accordingly.  Deneis any urinary symptoms except for frequency which she attributes to pregnancy. NL on 11/23/13, normal. Awaiting first screen results.  Prenatal panel and other initial serum labs not done, will do next visit along with AFP check. Anatomy scan ordered. Will start 17P next visit ~16 weeks.  No other complaints or concerns.  Routine obstetric precautions reviewed.

## 2013-12-03 NOTE — Patient Instructions (Signed)
Return to clinic for any obstetric concerns or go to [facility] for evaluation  

## 2013-12-05 LAB — CULTURE, OB URINE
COLONY COUNT: NO GROWTH
ORGANISM ID, BACTERIA: NO GROWTH

## 2013-12-17 ENCOUNTER — Ambulatory Visit (INDEPENDENT_AMBULATORY_CARE_PROVIDER_SITE_OTHER): Payer: Medicaid Other | Admitting: Family

## 2013-12-17 VITALS — BP 126/80 | HR 94 | Wt 172.0 lb

## 2013-12-17 DIAGNOSIS — Z3480 Encounter for supervision of other normal pregnancy, unspecified trimester: Secondary | ICD-10-CM

## 2013-12-17 DIAGNOSIS — O09892 Supervision of other high risk pregnancies, second trimester: Secondary | ICD-10-CM

## 2013-12-17 DIAGNOSIS — O09219 Supervision of pregnancy with history of pre-term labor, unspecified trimester: Secondary | ICD-10-CM

## 2013-12-17 DIAGNOSIS — Z3490 Encounter for supervision of normal pregnancy, unspecified, unspecified trimester: Secondary | ICD-10-CM

## 2013-12-17 DIAGNOSIS — O09212 Supervision of pregnancy with history of pre-term labor, second trimester: Secondary | ICD-10-CM

## 2013-12-17 LAB — POCT URINALYSIS DIP (DEVICE)
GLUCOSE, UA: NEGATIVE mg/dL
Ketones, ur: NEGATIVE mg/dL
LEUKOCYTES UA: NEGATIVE
NITRITE: NEGATIVE
Protein, ur: 30 mg/dL — AB
Specific Gravity, Urine: 1.02 (ref 1.005–1.030)
UROBILINOGEN UA: 0.2 mg/dL (ref 0.0–1.0)
pH: 6.5 (ref 5.0–8.0)

## 2013-12-17 MED ORDER — HYDROXYPROGESTERONE CAPROATE 250 MG/ML IM OIL
250.0000 mg | TOPICAL_OIL | INTRAMUSCULAR | Status: DC
  Administered 2013-12-17 – 2014-02-25 (×10): 250 mg via INTRAMUSCULAR

## 2013-12-17 NOTE — Progress Notes (Signed)
No questions or concerns.  AFP today and begin 17p.

## 2013-12-17 NOTE — Addendum Note (Signed)
Addended by: Sherre LainASH, AMANDA A on: 12/17/2013 08:50 AM   Modules accepted: Orders

## 2013-12-18 LAB — ALPHA FETOPROTEIN, MATERNAL
AFP: 32.7 IU/mL
Curr Gest Age: 16.4 wks.days
MoM for AFP: 1.14
OPEN SPINA BIFIDA: NEGATIVE
Osb Risk: 1:8390 {titer}

## 2013-12-20 ENCOUNTER — Encounter: Payer: Self-pay | Admitting: Family

## 2013-12-24 ENCOUNTER — Ambulatory Visit (INDEPENDENT_AMBULATORY_CARE_PROVIDER_SITE_OTHER): Payer: Medicaid Other | Admitting: *Deleted

## 2013-12-24 VITALS — BP 113/75 | HR 89 | Temp 98.2°F | Wt 172.6 lb

## 2013-12-24 DIAGNOSIS — O09219 Supervision of pregnancy with history of pre-term labor, unspecified trimester: Secondary | ICD-10-CM

## 2013-12-24 DIAGNOSIS — O09892 Supervision of other high risk pregnancies, second trimester: Secondary | ICD-10-CM

## 2013-12-24 DIAGNOSIS — O09212 Supervision of pregnancy with history of pre-term labor, second trimester: Principal | ICD-10-CM

## 2013-12-31 ENCOUNTER — Ambulatory Visit (INDEPENDENT_AMBULATORY_CARE_PROVIDER_SITE_OTHER): Payer: Medicaid Other | Admitting: *Deleted

## 2013-12-31 VITALS — BP 120/77 | HR 83 | Wt 173.0 lb

## 2013-12-31 DIAGNOSIS — O09892 Supervision of other high risk pregnancies, second trimester: Secondary | ICD-10-CM

## 2013-12-31 DIAGNOSIS — O09212 Supervision of pregnancy with history of pre-term labor, second trimester: Principal | ICD-10-CM

## 2013-12-31 DIAGNOSIS — O09219 Supervision of pregnancy with history of pre-term labor, unspecified trimester: Secondary | ICD-10-CM

## 2014-01-04 ENCOUNTER — Ambulatory Visit (HOSPITAL_COMMUNITY)
Admission: RE | Admit: 2014-01-04 | Discharge: 2014-01-04 | Disposition: A | Discharge status: Home / Self Care | Payer: Medicaid Other | Source: Ambulatory Visit | Attending: Obstetrics & Gynecology | Admitting: Obstetrics & Gynecology

## 2014-01-04 DIAGNOSIS — O09219 Supervision of pregnancy with history of pre-term labor, unspecified trimester: Secondary | ICD-10-CM | POA: Insufficient documentation

## 2014-01-04 DIAGNOSIS — O34219 Maternal care for unspecified type scar from previous cesarean delivery: Secondary | ICD-10-CM | POA: Diagnosis present

## 2014-01-04 DIAGNOSIS — O09899 Supervision of other high risk pregnancies, unspecified trimester: Secondary | ICD-10-CM

## 2014-01-05 ENCOUNTER — Encounter: Payer: Self-pay | Admitting: Obstetrics & Gynecology

## 2014-01-07 ENCOUNTER — Encounter: Payer: Self-pay | Admitting: Family Medicine

## 2014-01-07 ENCOUNTER — Ambulatory Visit (INDEPENDENT_AMBULATORY_CARE_PROVIDER_SITE_OTHER): Payer: Medicaid Other | Admitting: Family Medicine

## 2014-01-07 VITALS — BP 126/80 | HR 97 | Wt 175.6 lb

## 2014-01-07 DIAGNOSIS — O09891 Supervision of other high risk pregnancies, first trimester: Secondary | ICD-10-CM

## 2014-01-07 DIAGNOSIS — O0992 Supervision of high risk pregnancy, unspecified, second trimester: Secondary | ICD-10-CM

## 2014-01-07 DIAGNOSIS — O0990 Supervision of high risk pregnancy, unspecified, unspecified trimester: Secondary | ICD-10-CM

## 2014-01-07 DIAGNOSIS — O09211 Supervision of pregnancy with history of pre-term labor, first trimester: Secondary | ICD-10-CM

## 2014-01-07 DIAGNOSIS — O09219 Supervision of pregnancy with history of pre-term labor, unspecified trimester: Secondary | ICD-10-CM

## 2014-01-07 LAB — POCT URINALYSIS DIP (DEVICE)
Bilirubin Urine: NEGATIVE
Glucose, UA: NEGATIVE mg/dL
Ketones, ur: NEGATIVE mg/dL
Nitrite: NEGATIVE
PROTEIN: NEGATIVE mg/dL
SPECIFIC GRAVITY, URINE: 1.015 (ref 1.005–1.030)
Urobilinogen, UA: 0.2 mg/dL (ref 0.0–1.0)
pH: 7 (ref 5.0–8.0)

## 2014-01-07 NOTE — Patient Instructions (Signed)
Breastfeeding Deciding to breastfeed is one of the best choices you can make for you and your baby. A change in hormones during pregnancy causes your breast tissue to grow and increases the number and size of your milk ducts. These hormones also allow proteins, sugars, and fats from your blood supply to make breast milk in your milk-producing glands. Hormones prevent breast milk from being released before your baby is born as well as prompt milk flow after birth. Once breastfeeding has begun, thoughts of your baby, as well as his or her sucking or crying, can stimulate the release of milk from your milk-producing glands.  BENEFITS OF BREASTFEEDING For Your Baby  Your first milk (colostrum) helps your baby's digestive system function better.   There are antibodies in your milk that help your baby fight off infections.   Your baby has a lower incidence of asthma, allergies, and sudden infant death syndrome.   The nutrients in breast milk are better for your baby than infant formulas and are designed uniquely for your baby's needs.   Breast milk improves your baby's brain development.   Your baby is less likely to develop other conditions, such as childhood obesity, asthma, or type 2 diabetes mellitus.  For You   Breastfeeding helps to create a very special bond between you and your baby.   Breastfeeding is convenient. Breast milk is always available at the correct temperature and costs nothing.   Breastfeeding helps to burn calories and helps you lose the weight gained during pregnancy.   Breastfeeding makes your uterus contract to its prepregnancy size faster and slows bleeding (lochia) after you give birth.   Breastfeeding helps to lower your risk of developing type 2 diabetes mellitus, osteoporosis, and breast or ovarian cancer later in life. SIGNS THAT YOUR BABY IS HUNGRY Early Signs of Hunger  Increased alertness or activity.  Stretching.  Movement of the head from  side to side.  Movement of the head and opening of the mouth when the corner of the mouth or cheek is stroked (rooting).  Increased sucking sounds, smacking lips, cooing, sighing, or squeaking.  Hand-to-mouth movements.  Increased sucking of fingers or hands. Late Signs of Hunger  Fussing.  Intermittent crying. Extreme Signs of Hunger Signs of extreme hunger will require calming and consoling before your baby will be able to breastfeed successfully. Do not wait for the following signs of extreme hunger to occur before you initiate breastfeeding:   Restlessness.  A loud, strong cry.   Screaming. BREASTFEEDING BASICS Breastfeeding Initiation  Find a comfortable place to sit or lie down, with your neck and back well supported.  Place a pillow or rolled up blanket under your baby to bring him or her to the level of your breast (if you are seated). Nursing pillows are specially designed to help support your arms and your baby while you breastfeed.  Make sure that your baby's abdomen is facing your abdomen.   Gently massage your breast. With your fingertips, massage from your chest wall toward your nipple in a circular motion. This encourages milk flow. You may need to continue this action during the feeding if your milk flows slowly.  Support your breast with 4 fingers underneath and your thumb above your nipple. Make sure your fingers are well away from your nipple and your baby's mouth.   Stroke your baby's lips gently with your finger or nipple.   When your baby's mouth is open wide enough, quickly bring your baby to your   breast, placing your entire nipple and as much of the colored area around your nipple (areola) as possible into your baby's mouth.   More areola should be visible above your baby's upper lip than below the lower lip.   Your baby's tongue should be between his or her lower gum and your breast.   Ensure that your baby's mouth is correctly positioned  around your nipple (latched). Your baby's lips should create a seal on your breast and be turned out (everted).  It is common for your baby to suck about 2-3 minutes in order to start the flow of breast milk. Latching Teaching your baby how to latch on to your breast properly is very important. An improper latch can cause nipple pain and decreased milk supply for you and poor weight gain in your baby. Also, if your baby is not latched onto your nipple properly, he or she may swallow some air during feeding. This can make your baby fussy. Burping your baby when you switch breasts during the feeding can help to get rid of the air. However, teaching your baby to latch on properly is still the best way to prevent fussiness from swallowing air while breastfeeding. Signs that your baby has successfully latched on to your nipple:    Silent tugging or silent sucking, without causing you pain.   Swallowing heard between every 3-4 sucks.    Muscle movement above and in front of his or her ears while sucking.  Signs that your baby has not successfully latched on to nipple:   Sucking sounds or smacking sounds from your baby while breastfeeding.  Nipple pain. If you think your baby has not latched on correctly, slip your finger into the corner of your baby's mouth to break the suction and place it between your baby's gums. Attempt breastfeeding initiation again. Signs of Successful Breastfeeding Signs from your baby:   A gradual decrease in the number of sucks or complete cessation of sucking.   Falling asleep.   Relaxation of his or her body.   Retention of a small amount of milk in his or her mouth.   Letting go of your breast by himself or herself. Signs from you:  Breasts that have increased in firmness, weight, and size 1-3 hours after feeding.   Breasts that are softer immediately after breastfeeding.  Increased milk volume, as well as a change in milk consistency and color by  the fifth day of breastfeeding.   Nipples that are not sore, cracked, or bleeding. Signs That Your Baby is Getting Enough Milk  Wetting at least 3 diapers in a 24-hour period. The urine should be clear and pale yellow by age 5 days.  At least 3 stools in a 24-hour period by age 5 days. The stool should be soft and yellow.  At least 3 stools in a 24-hour period by age 7 days. The stool should be seedy and yellow.  No loss of weight greater than 10% of birth weight during the first 3 days of age.  Average weight gain of 4-7 ounces (113-198 g) per week after age 4 days.  Consistent daily weight gain by age 5 days, without weight loss after the age of 2 weeks. After a feeding, your baby may spit up a small amount. This is common. BREASTFEEDING FREQUENCY AND DURATION Frequent feeding will help you make more milk and can prevent sore nipples and breast engorgement. Breastfeed when you feel the need to reduce the fullness of your breasts   or when your baby shows signs of hunger. This is called "breastfeeding on demand." Avoid introducing a pacifier to your baby while you are working to establish breastfeeding (the first 4-6 weeks after your baby is born). After this time you may choose to use a pacifier. Research has shown that pacifier use during the first year of a baby's life decreases the risk of sudden infant death syndrome (SIDS). Allow your baby to feed on each breast as long as he or she wants. Breastfeed until your baby is finished feeding. When your baby unlatches or falls asleep while feeding from the first breast, offer the second breast. Because newborns are often sleepy in the first few weeks of life, you may need to awaken your baby to get him or her to feed. Breastfeeding times will vary from baby to baby. However, the following rules can serve as a guide to help you ensure that your baby is properly fed:  Newborns (babies 4 weeks of age or younger) may breastfeed every 1-3  hours.  Newborns should not go longer than 3 hours during the day or 5 hours during the night without breastfeeding.  You should breastfeed your baby a minimum of 8 times in a 24-hour period until you begin to introduce solid foods to your baby at around 6 months of age. BREAST MILK PUMPING Pumping and storing breast milk allows you to ensure that your baby is exclusively fed your breast milk, even at times when you are unable to breastfeed. This is especially important if you are going back to work while you are still breastfeeding or when you are not able to be present during feedings. Your lactation consultant can give you guidelines on how long it is safe to store breast milk.  A breast pump is a machine that allows you to pump milk from your breast into a sterile bottle. The pumped breast milk can then be stored in a refrigerator or freezer. Some breast pumps are operated by hand, while others use electricity. Ask your lactation consultant which type will work best for you. Breast pumps can be purchased, but some hospitals and breastfeeding support groups lease breast pumps on a monthly basis. A lactation consultant can teach you how to hand express breast milk, if you prefer not to use a pump.  CARING FOR YOUR BREASTS WHILE YOU BREASTFEED Nipples can become dry, cracked, and sore while breastfeeding. The following recommendations can help keep your breasts moisturized and healthy:  Avoid using soap on your nipples.   Wear a supportive bra. Although not required, special nursing bras and tank tops are designed to allow access to your breasts for breastfeeding without taking off your entire bra or top. Avoid wearing underwire-style bras or extremely tight bras.  Air dry your nipples for 3-4minutes after each feeding.   Use only cotton bra pads to absorb leaked breast milk. Leaking of breast milk between feedings is normal.   Use lanolin on your nipples after breastfeeding. Lanolin helps to  maintain your skin's normal moisture barrier. If you use pure lanolin, you do not need to wash it off before feeding your baby again. Pure lanolin is not toxic to your baby. You may also hand express a few drops of breast milk and gently massage that milk into your nipples and allow the milk to air dry. In the first few weeks after giving birth, some women experience extremely full breasts (engorgement). Engorgement can make your breasts feel heavy, warm, and tender to the   touch. Engorgement peaks within 3-5 days after you give birth. The following recommendations can help ease engorgement:  Completely empty your breasts while breastfeeding or pumping. You may want to start by applying warm, moist heat (in the shower or with warm water-soaked hand towels) just before feeding or pumping. This increases circulation and helps the milk flow. If your baby does not completely empty your breasts while breastfeeding, pump any extra milk after he or she is finished.  Wear a snug bra (nursing or regular) or tank top for 1-2 days to signal your body to slightly decrease milk production.  Apply ice packs to your breasts, unless this is too uncomfortable for you.  Make sure that your baby is latched on and positioned properly while breastfeeding. If engorgement persists after 48 hours of following these recommendations, contact your health care provider or a lactation consultant. OVERALL HEALTH CARE RECOMMENDATIONS WHILE BREASTFEEDING  Eat healthy foods. Alternate between meals and snacks, eating 3 of each per day. Because what you eat affects your breast milk, some of the foods may make your baby more irritable than usual. Avoid eating these foods if you are sure that they are negatively affecting your baby.  Drink milk, fruit juice, and water to satisfy your thirst (about 10 glasses a day).   Rest often, relax, and continue to take your prenatal vitamins to prevent fatigue, stress, and anemia.  Continue  breast self-awareness checks.  Avoid chewing and smoking tobacco.  Avoid alcohol and drug use. Some medicines that may be harmful to your baby can pass through breast milk. It is important to ask your health care provider before taking any medicine, including all over-the-counter and prescription medicine as well as vitamin and herbal supplements. It is possible to become pregnant while breastfeeding. If birth control is desired, ask your health care provider about options that will be safe for your baby. SEEK MEDICAL CARE IF:   You feel like you want to stop breastfeeding or have become frustrated with breastfeeding.  You have painful breasts or nipples.  Your nipples are cracked or bleeding.  Your breasts are red, tender, or warm.  You have a swollen area on either breast.  You have a fever or chills.  You have nausea or vomiting.  You have drainage other than breast milk from your nipples.  Your breasts do not become full before feedings by the fifth day after you give birth.  You feel sad and depressed.  Your baby is too sleepy to eat well.  Your baby is having trouble sleeping.   Your baby is wetting less than 3 diapers in a 24-hour period.  Your baby has less than 3 stools in a 24-hour period.  Your baby's skin or the white part of his or her eyes becomes yellow.   Your baby is not gaining weight by 5 days of age. SEEK IMMEDIATE MEDICAL CARE IF:   Your baby is overly tired (lethargic) and does not want to wake up and feed.  Your baby develops an unexplained fever. Document Released: 06/11/2005 Document Revised: 06/16/2013 Document Reviewed: 12/03/2012 ExitCare Patient Information 2015 [employer]. This information is not intended to replace advice given to you by your health care provider. Make sure you discuss any questions you have with your health care provider.  

## 2014-01-07 NOTE — Progress Notes (Signed)
C/o low pressure Cervix 3.4 cm on last exam Weekly 17 P

## 2014-01-14 ENCOUNTER — Ambulatory Visit (INDEPENDENT_AMBULATORY_CARE_PROVIDER_SITE_OTHER): Payer: Medicaid Other | Admitting: General Practice

## 2014-01-14 VITALS — BP 118/60 | HR 96 | Temp 98.0°F | Ht 63.0 in | Wt 174.2 lb

## 2014-01-14 DIAGNOSIS — O09219 Supervision of pregnancy with history of pre-term labor, unspecified trimester: Secondary | ICD-10-CM

## 2014-01-14 DIAGNOSIS — O09212 Supervision of pregnancy with history of pre-term labor, second trimester: Principal | ICD-10-CM

## 2014-01-14 DIAGNOSIS — O09892 Supervision of other high risk pregnancies, second trimester: Secondary | ICD-10-CM

## 2014-01-21 ENCOUNTER — Ambulatory Visit (INDEPENDENT_AMBULATORY_CARE_PROVIDER_SITE_OTHER): Payer: Medicaid Other

## 2014-01-21 VITALS — BP 137/83 | HR 106 | Wt 178.6 lb

## 2014-01-21 DIAGNOSIS — O09892 Supervision of other high risk pregnancies, second trimester: Secondary | ICD-10-CM

## 2014-01-21 DIAGNOSIS — O09219 Supervision of pregnancy with history of pre-term labor, unspecified trimester: Secondary | ICD-10-CM

## 2014-01-21 DIAGNOSIS — O09212 Supervision of pregnancy with history of pre-term labor, second trimester: Principal | ICD-10-CM

## 2014-01-28 ENCOUNTER — Ambulatory Visit (INDEPENDENT_AMBULATORY_CARE_PROVIDER_SITE_OTHER): Payer: Medicaid Other | Admitting: Obstetrics & Gynecology

## 2014-01-28 ENCOUNTER — Encounter: Payer: Self-pay | Admitting: Obstetrics & Gynecology

## 2014-01-28 VITALS — BP 137/73 | HR 97 | Temp 98.3°F | Wt 177.7 lb

## 2014-01-28 DIAGNOSIS — O09219 Supervision of pregnancy with history of pre-term labor, unspecified trimester: Secondary | ICD-10-CM

## 2014-01-28 DIAGNOSIS — O09212 Supervision of pregnancy with history of pre-term labor, second trimester: Secondary | ICD-10-CM

## 2014-01-28 DIAGNOSIS — O0990 Supervision of high risk pregnancy, unspecified, unspecified trimester: Secondary | ICD-10-CM

## 2014-01-28 DIAGNOSIS — O0992 Supervision of high risk pregnancy, unspecified, second trimester: Secondary | ICD-10-CM

## 2014-01-28 LAB — POCT URINALYSIS DIP (DEVICE)
BILIRUBIN URINE: NEGATIVE
Glucose, UA: NEGATIVE mg/dL
HGB URINE DIPSTICK: NEGATIVE
Ketones, ur: NEGATIVE mg/dL
Leukocytes, UA: NEGATIVE
NITRITE: NEGATIVE
PH: 7 (ref 5.0–8.0)
Protein, ur: NEGATIVE mg/dL
SPECIFIC GRAVITY, URINE: 1.015 (ref 1.005–1.030)
UROBILINOGEN UA: 0.2 mg/dL (ref 0.0–1.0)

## 2014-01-28 MED ORDER — PRENATAL PLUS 27-1 MG PO TABS: 1.0000 | ORAL_TABLET | Freq: Every day | ORAL | Status: DC

## 2014-01-28 NOTE — Patient Instructions (Signed)
Second Trimester of Pregnancy The second trimester is from week 13 through week 28, month 4 through 6. This is often the time in pregnancy that you feel your best. Often times, morning sickness has lessened or quit. You may have more energy, and you may get hungry more often. Your unborn baby (fetus) is growing rapidly. At the end of the sixth month, he or she is about 9 inches long and weighs about 1 pounds. You will likely feel the baby move (quickening) between 18 and 20 weeks of pregnancy. HOME CARE   Avoid all smoking, herbs, and alcohol. Avoid drugs not approved by your doctor.  Only take medicine as told by your doctor. Some medicines are safe and some are not during pregnancy.  Exercise only as told by your doctor. Stop exercising if you start having cramps.  Eat regular, healthy meals.  Wear a good support bra if your breasts are tender.  Do not use hot tubs, steam rooms, or saunas.  Wear your seat belt when driving.  Avoid raw meat, uncooked cheese, and liter boxes and soil used by cats.  Take your prenatal vitamins.  Try taking medicine that helps you poop (stool softener) as needed, and if your doctor approves. Eat more fiber by eating fresh fruit, vegetables, and whole grains. Drink enough fluids to keep your pee (urine) clear or pale yellow.  Take warm water baths (sitz baths) to soothe pain or discomfort caused by hemorrhoids. Use hemorrhoid cream if your doctor approves.  If you have puffy, bulging veins (varicose veins), wear support hose. Raise (elevate) your feet for 15 minutes, 3-4 times a day. Limit salt in your diet.  Avoid heavy lifting, wear low heals, and sit up straight.  Rest with your legs raised if you have leg cramps or low back pain.  Visit your dentist if you have not gone during your pregnancy. Use a soft toothbrush to brush your teeth. Be gentle when you floss.  You can have sex (intercourse) unless your doctor tells you not to.  Go to your  doctor visits. GET HELP IF:   You feel dizzy.  You have mild cramps or pressure in your lower belly (abdomen).  You have a nagging pain in your belly area.  You continue to feel sick to your stomach (nauseous), throw up (vomit), or have watery poop (diarrhea).  You have bad smelling fluid coming from your vagina.  You have pain with peeing (urination). GET HELP RIGHT AWAY IF:   You have a fever.  You are leaking fluid from your vagina.  You have spotting or bleeding from your vagina.  You have severe belly cramping or pain.  You lose or gain weight rapidly.  You have trouble catching your breath and have chest pain.  You notice sudden or extreme puffiness (swelling) of your face, hands, ankles, feet, or legs.  You have not felt the baby move in over an hour.  You have severe headaches that do not go away with medicine.  You have vision changes. Document Released: 09/05/2009 Document Revised: 10/06/2012 Document Reviewed: 08/12/2012 ExitCare Patient Information 2015 [employer]. This information is not intended to replace advice given to you by your health care provider. Make sure you discuss any questions you have with your health care provider.  

## 2014-01-28 NOTE — Progress Notes (Signed)
Pt with no complaints PNL today F/u sono for anatomy scheduled On 17-OH-P- no ctx

## 2014-01-29 LAB — OBSTETRIC PANEL
ANTIBODY SCREEN: NEGATIVE
Basophils Absolute: 0 10*3/uL (ref 0.0–0.1)
Basophils Relative: 0 % (ref 0–1)
EOS ABS: 0.3 10*3/uL (ref 0.0–0.7)
EOS PCT: 2 % (ref 0–5)
HCT: 34.2 % — ABNORMAL LOW (ref 36.0–46.0)
HEMOGLOBIN: 11.3 g/dL — AB (ref 12.0–15.0)
Hepatitis B Surface Ag: NEGATIVE
Lymphocytes Relative: 22 % (ref 12–46)
Lymphs Abs: 3.4 10*3/uL (ref 0.7–4.0)
MCH: 25.7 pg — AB (ref 26.0–34.0)
MCHC: 33 g/dL (ref 30.0–36.0)
MCV: 77.7 fL — AB (ref 78.0–100.0)
Monocytes Absolute: 0.9 10*3/uL (ref 0.1–1.0)
Monocytes Relative: 6 % (ref 3–12)
NEUTROS PCT: 70 % (ref 43–77)
Neutro Abs: 10.9 10*3/uL — ABNORMAL HIGH (ref 1.7–7.7)
Platelets: 316 10*3/uL (ref 150–400)
RBC: 4.4 MIL/uL (ref 3.87–5.11)
RDW: 16.1 % — ABNORMAL HIGH (ref 11.5–15.5)
RH TYPE: POSITIVE
Rubella: 19.1 Index — ABNORMAL HIGH (ref <0.90)
WBC: 15.6 10*3/uL — ABNORMAL HIGH (ref 4.0–10.5)

## 2014-01-29 LAB — HEMOGLOBINOPATHY EVALUATION
HGB A2 QUANT: 3.7 % — AB (ref 2.2–3.2)
HGB A: 72.2 % — AB (ref 96.8–97.8)
Hemoglobin Other: 72.2 % — ABNORMAL HIGH
Hgb F Quant: 0 % (ref 0.0–2.0)
Hgb S Quant: 0 %

## 2014-01-29 LAB — HIV ANTIBODY (ROUTINE TESTING W REFLEX): HIV: NONREACTIVE

## 2014-02-01 LAB — HGB ELECTROPHORESIS REFLEXED REPORT
HEMOGLOBIN A2 - HGBRFX: 3.9 % — AB (ref 1.8–3.5)
HEMOGLOBIN E: 25.6 % — AB
Hemoglobin A - HGBRFX: 70.5 % — ABNORMAL LOW (ref >96.0)
Hemoglobin F - HGBRFX: 0 % (ref <2.0)
SICKLE SOLUBILITY TEST - HGBRFX: NEGATIVE

## 2014-02-04 ENCOUNTER — Ambulatory Visit (INDEPENDENT_AMBULATORY_CARE_PROVIDER_SITE_OTHER): Payer: Medicaid Other | Admitting: *Deleted

## 2014-02-04 VITALS — BP 134/80

## 2014-02-04 DIAGNOSIS — O09219 Supervision of pregnancy with history of pre-term labor, unspecified trimester: Secondary | ICD-10-CM

## 2014-02-04 DIAGNOSIS — Z8751 Personal history of pre-term labor: Secondary | ICD-10-CM

## 2014-02-11 ENCOUNTER — Ambulatory Visit (INDEPENDENT_AMBULATORY_CARE_PROVIDER_SITE_OTHER): Payer: Medicaid Other | Admitting: General Practice

## 2014-02-11 VITALS — BP 123/69 | HR 99 | Temp 98.4°F | Ht 63.0 in | Wt 179.4 lb

## 2014-02-11 DIAGNOSIS — O09892 Supervision of other high risk pregnancies, second trimester: Secondary | ICD-10-CM

## 2014-02-11 DIAGNOSIS — O09212 Supervision of pregnancy with history of pre-term labor, second trimester: Principal | ICD-10-CM

## 2014-02-11 DIAGNOSIS — O09219 Supervision of pregnancy with history of pre-term labor, unspecified trimester: Secondary | ICD-10-CM

## 2014-02-16 ENCOUNTER — Encounter (HOSPITAL_COMMUNITY): Payer: Self-pay | Admitting: *Deleted

## 2014-02-16 ENCOUNTER — Ambulatory Visit (HOSPITAL_COMMUNITY)
Admission: RE | Admit: 2014-02-16 | Discharge: 2014-02-16 | Disposition: A | Discharge status: Home / Self Care | Payer: Medicaid Other | Source: Ambulatory Visit | Attending: Family Medicine | Admitting: Family Medicine

## 2014-02-16 ENCOUNTER — Inpatient Hospital Stay (HOSPITAL_COMMUNITY)
Admission: AD | Admit: 2014-02-16 | Discharge: 2014-02-20 | DRG: 782 | Disposition: A | Discharge status: Home / Self Care | Payer: Medicaid Other | Source: Ambulatory Visit | Attending: Obstetrics & Gynecology | Admitting: Obstetrics & Gynecology

## 2014-02-16 ENCOUNTER — Other Ambulatory Visit: Payer: Self-pay | Admitting: Family Medicine

## 2014-02-16 DIAGNOSIS — Z833 Family history of diabetes mellitus: Secondary | ICD-10-CM

## 2014-02-16 DIAGNOSIS — O358XX0 Maternal care for other (suspected) fetal abnormality and damage, not applicable or unspecified: Secondary | ICD-10-CM | POA: Insufficient documentation

## 2014-02-16 DIAGNOSIS — O0990 Supervision of high risk pregnancy, unspecified, unspecified trimester: Secondary | ICD-10-CM | POA: Insufficient documentation

## 2014-02-16 DIAGNOSIS — O34219 Maternal care for unspecified type scar from previous cesarean delivery: Secondary | ICD-10-CM | POA: Diagnosis present

## 2014-02-16 DIAGNOSIS — O26872 Cervical shortening, second trimester: Secondary | ICD-10-CM

## 2014-02-16 DIAGNOSIS — O365990 Maternal care for other known or suspected poor fetal growth, unspecified trimester, not applicable or unspecified: Secondary | ICD-10-CM

## 2014-02-16 DIAGNOSIS — O0992 Supervision of high risk pregnancy, unspecified, second trimester: Secondary | ICD-10-CM

## 2014-02-16 DIAGNOSIS — O09892 Supervision of other high risk pregnancies, second trimester: Secondary | ICD-10-CM

## 2014-02-16 DIAGNOSIS — O26879 Cervical shortening, unspecified trimester: Secondary | ICD-10-CM | POA: Diagnosis present

## 2014-02-16 DIAGNOSIS — O321XX0 Maternal care for breech presentation, not applicable or unspecified: Secondary | ICD-10-CM | POA: Diagnosis present

## 2014-02-16 DIAGNOSIS — O365920 Maternal care for other known or suspected poor fetal growth, second trimester, not applicable or unspecified: Secondary | ICD-10-CM

## 2014-02-16 DIAGNOSIS — O09212 Supervision of pregnancy with history of pre-term labor, second trimester: Secondary | ICD-10-CM

## 2014-02-16 LAB — ABO/RH: ABO/RH(D): O POS

## 2014-02-16 LAB — CBC
HCT: 34.3 % — ABNORMAL LOW (ref 36.0–46.0)
HEMOGLOBIN: 11.5 g/dL — AB (ref 12.0–15.0)
MCH: 26.6 pg (ref 26.0–34.0)
MCHC: 33.5 g/dL (ref 30.0–36.0)
MCV: 79.2 fL (ref 78.0–100.0)
Platelets: 292 10*3/uL (ref 150–400)
RBC: 4.33 MIL/uL (ref 3.87–5.11)
RDW: 14.8 % (ref 11.5–15.5)
WBC: 14.6 10*3/uL — ABNORMAL HIGH (ref 4.0–10.5)

## 2014-02-16 LAB — URINALYSIS, ROUTINE W REFLEX MICROSCOPIC
BILIRUBIN URINE: NEGATIVE
Glucose, UA: NEGATIVE mg/dL
Ketones, ur: NEGATIVE mg/dL
Leukocytes, UA: NEGATIVE
Nitrite: NEGATIVE
PH: 7 (ref 5.0–8.0)
Protein, ur: NEGATIVE mg/dL
Specific Gravity, Urine: <1.005 — ABNORMAL LOW (ref 1.005–1.030)
Urobilinogen, UA: 0.2 mg/dL (ref 0.0–1.0)

## 2014-02-16 LAB — TYPE AND SCREEN
ABO/RH(D): O POS
Antibody Screen: NEGATIVE

## 2014-02-16 LAB — URINE MICROSCOPIC-ADD ON

## 2014-02-16 LAB — WET PREP, GENITAL
Clue Cells Wet Prep HPF POC: NONE SEEN
Trich, Wet Prep: NONE SEEN
Yeast Wet Prep HPF POC: NONE SEEN

## 2014-02-16 LAB — OB RESULTS CONSOLE GBS: STREP GROUP B AG: NEGATIVE

## 2014-02-16 LAB — OB RESULTS CONSOLE GC/CHLAMYDIA
Chlamydia: NEGATIVE
Gonorrhea: NEGATIVE

## 2014-02-16 MED ORDER — LACTATED RINGERS IV SOLN
INTRAVENOUS | Status: DC
  Administered 2014-02-16 – 2014-02-17 (×3): via INTRAVENOUS

## 2014-02-16 MED ORDER — CALCIUM CARBONATE ANTACID 500 MG PO CHEW: 2.0000 | CHEWABLE_TABLET | ORAL | Status: DC | PRN

## 2014-02-16 MED ORDER — MAGNESIUM SULFATE BOLUS VIA INFUSION
4.0000 g | Freq: Once | INTRAVENOUS | Status: AC
  Administered 2014-02-16: 4 g via INTRAVENOUS
  Filled 2014-02-16: qty 500

## 2014-02-16 MED ORDER — PRENATAL MULTIVITAMIN CH
1.0000 | ORAL_TABLET | Freq: Every day | ORAL | Status: DC
  Administered 2014-02-16 – 2014-02-20 (×5): 1 via ORAL
  Filled 2014-02-16 (×5): qty 1

## 2014-02-16 MED ORDER — BETAMETHASONE SOD PHOS & ACET 6 (3-3) MG/ML IJ SUSP
12.0000 mg | INTRAMUSCULAR | Status: AC
  Administered 2014-02-16 – 2014-02-17 (×2): 12 mg via INTRAMUSCULAR
  Filled 2014-02-16 (×2): qty 2

## 2014-02-16 MED ORDER — MAGNESIUM SULFATE 40 G IN LACTATED RINGERS - SIMPLE
2.0000 g/h | INTRAVENOUS | Status: DC
  Filled 2014-02-16: qty 500

## 2014-02-16 MED ORDER — DOCUSATE SODIUM 100 MG PO CAPS
100.0000 mg | ORAL_CAPSULE | Freq: Every day | ORAL | Status: DC
  Administered 2014-02-16 – 2014-02-20 (×5): 100 mg via ORAL
  Filled 2014-02-16 (×5): qty 1

## 2014-02-16 MED ORDER — ACETAMINOPHEN 325 MG PO TABS: 650.0000 mg | ORAL_TABLET | ORAL | Status: DC | PRN

## 2014-02-16 MED ORDER — HYDROXYPROGESTERONE CAPROATE 250 MG/ML IM OIL
250.0000 mg | TOPICAL_OIL | INTRAMUSCULAR | Status: DC
  Administered 2014-02-18: 250 mg via INTRAMUSCULAR
  Filled 2014-02-16: qty 1

## 2014-02-16 MED ORDER — ZOLPIDEM TARTRATE 5 MG PO TABS
5.0000 mg | ORAL_TABLET | Freq: Every evening | ORAL | Status: DC | PRN
Start: 2014-02-16 — End: 2014-02-20

## 2014-02-16 NOTE — H&P (Signed)
Brittany Perez is a 28 y.o. female [phone] with IUP at [redacted]w[redacted]d presenting for a shortened cervix on ultrasound. No contractions, LOF, or vaginal bleeding noted. Good fetal movement.  Pt concerned as her first delivery was at [redacted]w[redacted]d and had a similar presentation.  During that pregnancy, she required a c-section.  Her second delivery was a term VBAC as the patient had weekly 17P injections since ~16wks   The pt has had weekly 17P injections this pregnancy. Last U/S showed a shortened cervix, funneling of the internal os with a cervix that appears to be dilated ~1cm. She was noted to have an AC~10% and the patient is currently breech.  [facility] [redacted]w[redacted]d  EDD 05/30/14 by LMP, consistent with [redacted]w[redacted]d U/S  Prenatal History/Complications: h/o preterm labor, h/o LTCS with successful VBAC  Past Medical History: Past Medical History  Diagnosis Date  . Preterm labor     Past Surgical History: Past Surgical History  Procedure Laterality Date  . Cesarean section      Obstetrical History: OB History   Grav Para Term Preterm Abortions TAB SAB Ect Mult Living   0 0 0 0 0 2     Social History: History   Social History  . Marital Status: Single    Spouse Name: N/A    Number of Children: N/A  . Years of Education: N/A   Social History Main Topics  . Smoking status: Never Smoker   . Smokeless tobacco: Never Used  . Alcohol Use: No  . Drug Use: No  . Sexual Activity: Yes   Other Topics Concern  . None   Social History Narrative  . None    Family History: Family History  Problem Relation Age of Onset  . Diabetes Mother     Allergies: No Known Allergies  Facility-administered medications prior to admission  Medication Dose Route Frequency Provider Last Rate Last Dose  . hydroxyprogesterone caproate (DELALUTIN) 250 mg/mL injection 250 mg  250 mg Intramuscular Weekly Walidah N Karim, CNM   250 mg at 02/11/14 1100   Prescriptions prior to admission  Medication Sig  Dispense Refill  . Prenatal Vit-Fe Fumarate-FA (PRENATAL MULTIVITAMIN) TABS tablet Take 1 tablet by mouth daily at 12 noon.         Review of Systems   Constitutional: No fever, chills, or fatigue  Blood pressure 115/68, pulse 92, temperature 98.2 F (36.8 C), resp. rate 18, last menstrual period 08/23/2013. General appearance: alert, cooperative and no distress Lungs: clear to auscultation bilaterally Heart: regular rate and rhythm Abdomen: soft, non-tender; bowel sounds normal Pelvic: closed, thick, high.  Extremities: Homans sign is negative, no sign of DVT DTR's 2+ Presentation: unsure Fetal monitoringBaseline: 140 bpm, Variability: Good {> 6 bpm), Accelerations: Non-reactive but appropriate for gestational age and Decelerations: Absent Uterine activityNone Dilation: Closed (visibly closed) Exam by:: Dorathy Kinsman, CNM   Prenatal labs: ABO, Rh: --/--/O POS, O POS (08/25 1139) Antibody: NEG (08/25 1139) Rubella:  Immune RPR: NON REAC (08/06 1128)  HBsAg: NEGATIVE (08/06 1128)  HIV: NONREACTIVE (08/06 1128)  GBS:   Unknown 1 hr Glucola: Not done Genetic screening  Neg Anatomy US Breech, AC~10%   Prenatal Transfer Tool  Maternal Diabetes: No Genetic Screening: Normal Maternal Ultrasounds/Referrals: Normal Fetal Ultrasounds or other Referrals:  Referred to Materal Fetal Medicine  Maternal Substance Abuse:  No Significant Maternal Medications:  None Significant Maternal Lab Results: Lab values include: Other: GBS Unknown     Results for orders  placed during the hospital encounter of 02/16/14 (from the past 24 hour(s))  WET PREP, GENITAL   Collection Time    02/16/14 11:30 AM      Result Value Ref Range   Yeast Wet Prep HPF POC NONE SEEN  NONE SEEN   Trich, Wet Prep NONE SEEN  NONE SEEN   Clue Cells Wet Prep HPF POC NONE SEEN  NONE SEEN   WBC, Wet Prep HPF POC MODERATE (*) NONE SEEN  URINALYSIS, ROUTINE W REFLEX MICROSCOPIC   Collection Time    02/16/14  11:35 AM      Result Value Ref Range   Color, Urine YELLOW  YELLOW   APPearance CLEAR  CLEAR   Specific Gravity, Urine <1.005 (*) 1.005 - 1.030   pH 7.0  5.0 - 8.0   Glucose, UA NEGATIVE  NEGATIVE mg/dL   Hgb urine dipstick SMALL (*) NEGATIVE   Bilirubin Urine NEGATIVE  NEGATIVE   Ketones, ur NEGATIVE  NEGATIVE mg/dL   Protein, ur NEGATIVE  NEGATIVE mg/dL   Urobilinogen, UA 0.2  0.0 - 1.0 mg/dL   Nitrite NEGATIVE  NEGATIVE   Leukocytes, UA NEGATIVE  NEGATIVE  URINE MICROSCOPIC-ADD ON   Collection Time    02/16/14 11:35 AM      Result Value Ref Range   Squamous Epithelial / LPF RARE  RARE   WBC, UA 0-2  <3 WBC/hpf   RBC / HPF 3-6  <3 RBC/hpf  CBC   Collection Time    02/16/14 11:39 AM      Result Value Ref Range   WBC 14.6 (*) 4.0 - 10.5 K/uL   RBC 4.33  3.87 - 5.11 MIL/uL   Hemoglobin 11.5 (*) 12.0 - 15.0 g/dL   HCT 40.9 (*) 81.1 - 91.4 %   MCV 79.2  78.0 - 100.0 fL   MCH 26.6  26.0 - 34.0 pg   MCHC 33.5  30.0 - 36.0 g/dL   RDW 78.2  95.6 - 21.3 %   Platelets 292  150 - 400 K/uL  TYPE AND SCREEN   Collection Time    02/16/14 11:39 AM      Result Value Ref Range   ABO/RH(D) O POS     Antibody Screen NEG     Sample Expiration 02/19/2014    ABO/RH   Collection Time    02/16/14 11:39 AM      Result Value Ref Range   ABO/RH(D) O POS     U/S 8/25: Funneling of the internal os, cervix appears to be dilated ~1cm    Assessment: Brittany Perez is a 28 y.o. [id number] at [redacted]w[redacted]d by LMP, consistent with [redacted]w[redacted]d U/S #Labor: Betamethasone and Mag sulfate. No contractions per patient report or on FHTs. If labor progresses, may require a c-section as fetus is currently breech #Pain: None currently #FWB: Cat 1 #ID: GBS unknown, will get GBS culture. GC/Chlamyida pending.  #MOF: Breast #MOC:Unknown  #Circ:  Girl   Joanna Puff 02/16/2014, 2:20 PM   I was present for the exam and agree with above.  [name], [place] 02/16/2014 7:15 PM

## 2014-02-16 NOTE — H&P (Signed)
Attestation of Attending Supervision of Advanced Practitioner (PA/CNM/NP): Evaluation and management procedures were performed by the Advanced Practitioner under my supervision and collaboration.  I have reviewed the Advanced Practitioner's note and chart, and I agree with the management and plan.  Raziel Koenigs, MD, FACOG Attending Obstetrician & Gynecologist Faculty Practice, [facility]   

## 2014-02-16 NOTE — Progress Notes (Signed)
Received call from hospital pharmacy stating patient is inpatient on antenatal and is due for dose of 17P Thursday. They are requesting 17P be sent to pharmacy and state it will be returned to clinic when patient is discharged. Medication tubed up to pharmacy.

## 2014-02-17 ENCOUNTER — Encounter: Payer: Self-pay | Admitting: General Practice

## 2014-02-17 LAB — URINE CULTURE
COLONY COUNT: NO GROWTH
CULTURE: NO GROWTH

## 2014-02-17 MED ORDER — SODIUM CHLORIDE 0.9 % IJ SOLN
3.0000 mL | Freq: Two times a day (BID) | INTRAMUSCULAR | Status: DC
  Administered 2014-02-17 – 2014-02-19 (×5): 3 mL via INTRAVENOUS

## 2014-02-17 NOTE — Consult Note (Signed)
Neonatology Consult Note:  At the request of the patients obstetrician Dr. Harolyn Rutherford I met with Brittany Perez and her husband with discussion via interpreter.  She is currently 25 [redacted] weeks pregnant with pregnancy complicated by short cervix.  She has been receiving weekly 17P injections this pregnancy. Last U/S showed a shortened cervix, funneling of the internal os with a cervix that appears to be dilated ~1cm. She was noted to have an AC~10% and the patient is currently breech. She is on Magnesium at 2G / hr and received her first dose of BMZ yesterday. She has a history of 25 week prematurity.  That child is now 64 years old and is doing well in school.  They report no complications of prematurity.   We discussed morbidity/mortality at this gestional age, delivery room resuscitation, including intubation and surfactant in DR.  Discussed mechanical ventilation and risk for chronic lung disease, risk for IVH with potential for motor / cognitive deficits, ROP, NEC, sepsis, as well as temperature instability and feeding immaturity.  Discussed NG / OG feeds, benefits of MBM in reducing incidence of NEC.   Discussed likely length of stay. Thank you for allowing [place] to participate in her care.  Please call with questions.  Higinio Roger, DO  Neonatologist  The total length of face-to-face or floor / unit time for this encounter was 30 minutes.  Counseling and / or coordination of care was greater than fifty percent of the time.

## 2014-02-17 NOTE — Progress Notes (Signed)
[place] interpreter at bedside.  Dr Algernon Huxley notified, he will be down to do NICU consult.

## 2014-02-17 NOTE — Progress Notes (Signed)
Ur chart review completed.  

## 2014-02-17 NOTE — Progress Notes (Signed)
Patient ID: Brittany Perez, female   DOB: [birth year], 28 y.o.   MRN: [id number] FACULTY PRACTICE ANTEPARTUM(COMPREHENSIVE) NOTE  Brittany Perez is a 28 y.o. [id number] at [redacted]w[redacted]d by LMP who is admitted for short cervix.   Fetal presentation is breech. Length of Stay:  1  Days  Subjective: Mild cramping Patient reports the fetal movement as active. Patient reports uterine contraction  activity as mild cramping. Patient reports  vaginal bleeding as none. Patient describes fluid per vagina as None.  Vitals:  Blood pressure 118/62, pulse 95, temperature 98 F (36.7 C), temperature source Oral, resp. rate 16, height  (1.6 m), weight 179 lb 9.6 oz (81.466 kg), last menstrual period 08/23/2013. Physical Examination:  General appearance - alert, well appearing, and in no distress Heart - normal rate and regular rhythm Abdomen - soft, nontender, nondistended Fundal Height:  size equals dates Cervical Exam: Not evaluated. Extremities: extremities normal, atraumatic, no cyanosis or edema and Homans sign is negative, no sign of DVT with DTRs 2+ bilaterally Membranes:intact  Fetal Monitoring:  Baseline: 135 bpm, Variability: Good {> 6 bpm), Accelerations: Non-reactive but appropriate for gestational age and Decelerations: Absent  Labs:  Results for orders placed during the hospital encounter of 02/16/14 (from the past 24 hour(s))  WET PREP, GENITAL   Collection Time    02/16/14 11:30 AM      Result Value Ref Range   Yeast Wet Prep HPF POC NONE SEEN  NONE SEEN   Trich, Wet Prep NONE SEEN  NONE SEEN   Clue Cells Wet Prep HPF POC NONE SEEN  NONE SEEN   WBC, Wet Prep HPF POC MODERATE (*) NONE SEEN  URINALYSIS, ROUTINE W REFLEX MICROSCOPIC   Collection Time    02/16/14 11:35 AM      Result Value Ref Range   Color, Urine YELLOW  YELLOW   APPearance CLEAR  CLEAR   Specific Gravity, Urine <1.005 (*) 1.005 - 1.030   pH 7.0  5.0 - 8.0   Glucose, UA NEGATIVE  NEGATIVE mg/dL   Hgb urine dipstick SMALL (*)  NEGATIVE   Bilirubin Urine NEGATIVE  NEGATIVE   Ketones, ur NEGATIVE  NEGATIVE mg/dL   Protein, ur NEGATIVE  NEGATIVE mg/dL   Urobilinogen, UA 0.2  0.0 - 1.0 mg/dL   Nitrite NEGATIVE  NEGATIVE   Leukocytes, UA NEGATIVE  NEGATIVE  URINE MICROSCOPIC-ADD ON   Collection Time    02/16/14 11:35 AM      Result Value Ref Range   Squamous Epithelial / LPF RARE  RARE   WBC, UA 0-2  <3 WBC/hpf   RBC / HPF 3-6  <3 RBC/hpf  CBC   Collection Time    02/16/14 11:39 AM      Result Value Ref Range   WBC 14.6 (*) 4.0 - 10.5 K/uL   RBC 4.33  3.87 - 5.11 MIL/uL   Hemoglobin 11.5 (*) 12.0 - 15.0 g/dL   HCT 62.9 (*) 52.8 - 41.3 %   MCV 79.2  78.0 - 100.0 fL   MCH 26.6  26.0 - 34.0 pg   MCHC 33.5  30.0 - 36.0 g/dL   RDW 24.4  01.0 - 27.2 %   Platelets 292  150 - 400 K/uL  TYPE AND SCREEN   Collection Time    02/16/14 11:39 AM      Result Value Ref Range   ABO/RH(D) O POS     Antibody Screen NEG     Sample Expiration 02/19/2014  ABO/RH   Collection Time    02/16/14 11:39 AM      Result Value Ref Range   ABO/RH(D) O POS      Imaging Studies:      Currently EPIC will not allow sonographic studies to automatically populate into notes.  In the meantime, copy and paste results into note or free text.  Medications:  Scheduled . betamethasone acetate-betamethasone sodium phosphate  12 mg Intramuscular Q24H  . docusate sodium  100 mg Oral Daily  . [START ON 02/18/2014] hydroxyprogesterone caproate  250 mg Intramuscular Weekly  . prenatal multivitamin  1 tablet Oral Q1200   I have reviewed the patient's current medications.  ASSESSMENT: Patient Active Problem List   Diagnosis Date Noted  . Short cervical length during pregnancy 02/16/2014  . Language barrier, speaks [place] only 12/03/2013  . Supervision of high-risk pregnancy 11/12/2013  . H/O preterm delivery, currently pregnant 11/12/2013  . Previous cesarean delivery affecting pregnancy, antepartum 11/12/2013    PLAN: D/C  magnesium after 24 hr  ARNOLD,JAMES 02/17/2014,10:01 AM

## 2014-02-18 ENCOUNTER — Inpatient Hospital Stay (HOSPITAL_COMMUNITY): Payer: Medicaid Other

## 2014-02-18 ENCOUNTER — Ambulatory Visit: Payer: Medicaid Other

## 2014-02-18 LAB — GC/CHLAMYDIA PROBE AMP
CT PROBE, AMP APTIMA: NEGATIVE
GC Probe RNA: NEGATIVE

## 2014-02-18 LAB — CULTURE, BETA STREP (GROUP B ONLY)

## 2014-02-18 LAB — OB RESULTS CONSOLE GBS
GBS: NEGATIVE
GBS: NEGATIVE
GBS: NEGATIVE
STREP GROUP B AG: NEGATIVE

## 2014-02-18 MED ORDER — PROGESTERONE MICRONIZED 200 MG PO CAPS
200.0000 mg | ORAL_CAPSULE | Freq: Every day | ORAL | Status: DC
  Administered 2014-02-18 – 2014-02-19 (×2): 200 mg via VAGINAL
  Filled 2014-02-18 (×2): qty 1

## 2014-02-18 NOTE — Progress Notes (Addendum)
FACULTY PRACTICE ANTEPARTUM(COMPREHENSIVE) NOTE  Brittany Perez is a 28 y.o. [id number] at [redacted]w[redacted]d by early ultrasound who is admitted for short cervix, to receive BMZ, the cervix noted on admit to be funnelling to the exteral os, with funnel width 0.6 cm at internal os, 1 cm at ext os., Mag sulfate also givenx 24 hours..   Fetal presentation is breech. Length of Stay:  2  Days  Subjective: Pt denies any contractions or bleeding. Patient reports the fetal movement as active. Patient reports uterine contraction  activity as none. Patient reports  vaginal bleeding as none. Patient describes fluid per vagina as None.  Vitals:  Blood pressure 116/60, pulse 95, temperature 98.2 F (36.8 C), temperature source Oral, resp. rate 18, height  (1.6 m), weight 179 lb 9.6 oz (81.466 kg), last menstrual period 08/23/2013. Physical Examination:  General appearance - alert, well appearing, and in no distress Heart - normal rate and regular rhythm Abdomen - soft, nontender, nondistended Fundal Height:  size equals dates Cervical Exam: Bedside u/s by me shows fluid below the breech 2.5 cm width. I cannot be sure if this represents fluid above the internal os, or represents progress in the cervical dilation and funnelling.breech.confrims breech, and normal fluid Extremities: extremities normal, atraumatic, no cyanosis or edema and Homans sign is negative, no sign of DVT with DTRs 2+ bilaterally Membranes:intact  Fetal Monitoring:  NST's bid  Labs:  No results found for this or any previous visit (from the past 24 hour(s)).  Imaging Studies:     Currently EPIC will not allow sonographic studies to automatically populate into notes.  In the meantime, copy and paste results into note or free text.  Medications:  Scheduled . docusate sodium  100 mg Oral Daily  . hydroxyprogesterone caproate  250 mg Intramuscular Weekly  . prenatal multivitamin  1 tablet Oral Q1200  . sodium chloride  3 mL Intravenous Q12H    I have reviewed the patient's current medications.  ASSESSMENT: Patient Active Problem List   Diagnosis Date Noted  . Short cervical length during pregnancy 02/16/2014  . Language barrier, speaks [place] only 12/03/2013  . Supervision of high-risk pregnancy 11/12/2013  . H/O preterm delivery, currently pregnant 11/12/2013  . Previous cesarean delivery affecting pregnancy, antepartum 11/12/2013    PLAN: Will order formal u/s recheck of cervical length, then decide on outpt care if funnelling has improved, consider inpt if the funnel is now 2.5 cm width and cervix has deteriorated. NST , uterine activity monitoring this a.m  FERGUSON,JOHN V 02/18/2014,7:19 AM     Addendum:  Cervical exam is almost 2 cm/100%?soft and midposition.  I think pt would be best served in the hospital given gestational age and cervical exam.  Will add PV progesterone.  Brittany Perez H.

## 2014-02-19 ENCOUNTER — Encounter: Payer: Self-pay | Admitting: Obstetrics & Gynecology

## 2014-02-19 DIAGNOSIS — O26879 Cervical shortening, unspecified trimester: Principal | ICD-10-CM

## 2014-02-19 LAB — TYPE AND SCREEN
ABO/RH(D): O POS
Antibody Screen: NEGATIVE

## 2014-02-19 NOTE — Progress Notes (Signed)
FACULTY PRACTICE ANTEPARTUM(COMPREHENSIVE) NOTE  Brittany Perez is a 28 y.o. [id number] at [redacted]w[redacted]d by early ultrasound who is admitted for short cervix; the cervix noted on admit to be funnelling to the exteral os, with funnel width 0.6 cm at internal os, 1 cm at ext os.  Received BMZ, mag sulfate also given x 24 hours. On Prometrium and 17P.   Fetal presentation is breech.  Length of Stay:  3  Days  Subjective: Pt denies any contractions or bleeding. No other concerning symptoms. Patient reports the fetal movement as active. Patient reports uterine contraction  activity as none. Patient reports  vaginal bleeding as none. Patient describes fluid per vagina as None.  Vitals:  Blood pressure 106/54, pulse 85, temperature 98 F (36.7 C), temperature source Oral, resp. rate 18, height  (1.6 m), weight 179 lb 9.6 oz (81.466 kg), last menstrual period 08/23/2013. Physical Examination:  General appearance - alert, well appearing, and in no distress Heart - normal rate and regular rhythm Abdomen - soft, nontender, nondistended Fundal Height:  size equals dates Cervical Exam:  2 cm/100%?soft and midposition on Dr. Bertram [place] exam on 02/18/14. Extremities: extremities normal, atraumatic, no cyanosis or edema and Homans sign is negative, no sign of DVT with DTRs 2+ bilaterally Membranes:intact  Fetal Monitoring:  Category 1 Tracing  Imaging Studies:    02/18/14   Improved  from admission; now 4-5 mm cervical lengthwith distal end closed. Still funneling to internal os.  Medications:  Scheduled . docusate sodium  100 mg Oral Daily  . hydroxyprogesterone caproate  250 mg Intramuscular Weekly  . prenatal multivitamin  1 tablet Oral Q1200  . progesterone  200 mg Vaginal QHS  . sodium chloride  3 mL Intravenous Q12H   I have reviewed the patient's current medications.  ASSESSMENT: Patient Active Problem List   Diagnosis Date Noted  . Short cervical length during pregnancy 02/16/2014  . Language  barrier, speaks [place] only 12/03/2013  . Supervision of high-risk pregnancy 11/12/2013  . H/O preterm delivery, currently pregnant 11/12/2013  . Previous cesarean delivery affecting pregnancy, antepartum 11/12/2013    PLAN: Since ultrasound is improved and patient is asymptomatic, may consider outpatient management if digital cervical examination remains stable.  For now, will continue PV prometrium and 17P. Continue routine inpatient care for now  Tereso Newcomer, MD 02/19/2014,7:03 AM

## 2014-02-20 MED ORDER — PROGESTERONE MICRONIZED 200 MG PO CAPS: 200.0000 mg | ORAL_CAPSULE | Freq: Every day | ORAL | Status: DC

## 2014-02-20 MED ORDER — DSS 100 MG PO CAPS: 100.0000 mg | ORAL_CAPSULE | Freq: Every day | ORAL | Status: DC

## 2014-02-20 NOTE — Discharge Summary (Signed)
Antenatal Physician Discharge Summary  Patient ID: Brittany Perez MRN: [id number] DOB/AGE: 1986/06/07 28 y.o.  Admit date: 02/16/2014 Discharge date: 02/20/2014  Admission Diagnoses: Cervical insufficiency; preterm cervical dilation   Discharge Diagnoses: same  Prenatal Procedures: ultrasound  Intrapartum Procedures: Neonatology, Maternal Fetal Medicine; observation   Significant Diagnostic Studies:  Results for orders placed during the hospital encounter of 02/16/14 (from the past 168 hour(s))  CULTURE, BETA STREP (GROUP B ONLY)   Collection Time    02/16/14 11:30 AM      Result Value Ref Range   Specimen Description VAGINAL/RECTAL     Special Requests NONE     Culture       Value: NO GROUP B STREP (S.AGALACTIAE) ISOLATED     Performed at [facility]   Report Status 02/18/2014 FINAL    GC/CHLAMYDIA PROBE AMP   Collection Time    02/16/14 11:30 AM      Result Value Ref Range   CT Probe RNA NEGATIVE  NEGATIVE   GC Probe RNA NEGATIVE  NEGATIVE  WET PREP, GENITAL   Collection Time    02/16/14 11:30 AM      Result Value Ref Range   Yeast Wet Prep HPF POC NONE SEEN  NONE SEEN   Trich, Wet Prep NONE SEEN  NONE SEEN   Clue Cells Wet Prep HPF POC NONE SEEN  NONE SEEN   WBC, Wet Prep HPF POC MODERATE (*) NONE SEEN  URINE CULTURE   Collection Time    02/16/14 11:35 AM      Result Value Ref Range   Specimen Description URINE, CLEAN CATCH     Special Requests NONE     Culture  Setup Time       Value: 02/16/2014 20:03     Performed at [facility] Count       Value: NO GROWTH     Performed at [facility]   Culture       Value: NO GROWTH     Performed at [facility]   Report Status 02/17/2014 FINAL    URINALYSIS, ROUTINE W REFLEX MICROSCOPIC   Collection Time    02/16/14 11:35 AM      Result Value Ref Range   Color, Urine YELLOW  YELLOW   APPearance CLEAR  CLEAR   Specific Gravity, Urine <1.005 (*) 1.005 - 1.030   pH 7.0  5.0 -  8.0   Glucose, UA NEGATIVE  NEGATIVE mg/dL   Hgb urine dipstick SMALL (*) NEGATIVE   Bilirubin Urine NEGATIVE  NEGATIVE   Ketones, ur NEGATIVE  NEGATIVE mg/dL   Protein, ur NEGATIVE  NEGATIVE mg/dL   Urobilinogen, UA 0.2  0.0 - 1.0 mg/dL   Nitrite NEGATIVE  NEGATIVE   Leukocytes, UA NEGATIVE  NEGATIVE  URINE MICROSCOPIC-ADD ON   Collection Time    02/16/14 11:35 AM      Result Value Ref Range   Squamous Epithelial / LPF RARE  RARE   WBC, UA 0-2  <3 WBC/hpf   RBC / HPF 3-6  <3 RBC/hpf  CBC   Collection Time    02/16/14 11:39 AM      Result Value Ref Range   WBC 14.6 (*) 4.0 - 10.5 K/uL   RBC 4.33  3.87 - 5.11 MIL/uL   Hemoglobin 11.5 (*) 12.0 - 15.0 g/dL   HCT 40.9 (*) 81.1 - 91.4 %   MCV 79.2  78.0 - 100.0 fL   MCH 26.6  26.0 - 34.0 pg   MCHC 33.5  30.0 - 36.0 g/dL   RDW 16.1  09.6 - 04.5 %   Platelets 292  150 - 400 K/uL  TYPE AND SCREEN   Collection Time    02/16/14 11:39 AM      Result Value Ref Range   ABO/RH(D) O POS     Antibody Screen NEG     Sample Expiration 02/19/2014    ABO/RH   Collection Time    02/16/14 11:39 AM      Result Value Ref Range   ABO/RH(D) O POS    OB RESULTS CONSOLE GBS   Collection Time    02/18/14 12:00 AM      Result Value Ref Range   GBS Negative    OB RESULTS CONSOLE GBS   Collection Time    02/18/14 12:00 AM      Result Value Ref Range   GBS Negative    OB RESULTS CONSOLE GBS   Collection Time    02/18/14 12:00 AM      Result Value Ref Range   GBS Negative    OB RESULTS CONSOLE GBS   Collection Time    02/18/14 12:00 AM      Result Value Ref Range   GBS Negative    TYPE AND SCREEN   Collection Time    02/19/14 10:47 AM      Result Value Ref Range   ABO/RH(D) O POS     Antibody Screen NEG     Sample Expiration 02/22/2014      Treatments: Prometrium added; s/p BMZ and Magnesium sulfate  Hospital Course:  This is a 28 y.o. [id number] with IUP at [redacted]w[redacted]d admitted for cervical dilation and funneling.  She reports a h/o  silent dilation in her first pregnancy.  She denies contractions at present. She and her spouse request discharge if possible.  They live 6-7 min away and both report that she has her mother-in-law living with them who will be doing all of the housework.  She has transportation available at all times and will f/u if she has any adverse sx.  She noted on admission to have cervical exam visually of long and closed and on 8/27 to be 2cm and 100%.  Today she is 2cm 40-50% and posterior.  No leaking of fluid and no bleeding.  She was initially started on magnesium sulfate for  neuroprotection and also received betamethasone x 2 doses.  She has been on 17-OH-P and was also started on Prometrium this visit. She was seen by Neonatology during her stay.  She was observed, fetal heart rate monitoring remained reassuring, and she had no signs/symptoms of progressing cervical change or other maternal-fetal concerns.  Her cervical exam appear improved from her last exam.  She was deemed stable for discharge to home with outpatient follow up.  Discharge Exam: BP 115/61  Pulse 87  Temp(Src) 98.2 F (36.8 C) (Oral)  Resp 18  Ht  (1.6 m)  Wt 179 lb 9.6 oz (81.466 kg)  BMI 31.82 kg/m2  LMP 08/23/2013 General appearance: alert and no distress GI: soft, non-tender; bowel sounds normal; no masses,  no organomegaly and gravid Pelvic: 2cm /40-50%/post/ballottable  Extremities: extremities normal, atraumatic, no cyanosis or edema  Discharge Condition: good  Disposition: stable cervical insufficiency. No s/sx of progressive dilation  Discharge Instructions   Discharge activity:  Up to eat    Complete by:  As directed      Discharge diet:  No restrictions    Complete by:  As directed      No sexual activity restrictions    Complete by:  As directed      Notify physician for a general feeling that "something is not right"    Complete by:  As directed      Notify physician for increase or change in vaginal  discharge    Complete by:  As directed      Notify physician for intestinal cramps, with or without diarrhea, sometimes described as "gas pain"    Complete by:  As directed      Notify physician for leaking of fluid    Complete by:  As directed      Notify physician for low, dull backache, unrelieved by heat or Tylenol    Complete by:  As directed      Notify physician for menstrual like cramps    Complete by:  As directed      Notify physician for pelvic pressure    Complete by:  As directed      Notify physician for uterine contractions.  These may be painless and feel like the uterus is tightening or the baby is  "balling up"    Complete by:  As directed      Notify physician for vaginal bleeding    Complete by:  As directed      PRETERM LABOR:  Includes any of the follwing symptoms that occur between 20 - [redacted] weeks gestation.  If these symptoms are not stopped, preterm labor can result in preterm delivery, placing your baby at risk    Complete by:  As directed             Medication List         DSS 100 MG Caps  Take 100 mg by mouth daily.     prenatal multivitamin Tabs tablet  Take 1 tablet by mouth daily at 12 noon.     progesterone 200 MG capsule  Commonly known as:  PROMETRIUM  Place 1 capsule (200 mg total) vaginally at bedtime.           Follow-up Information   Follow up with STINSON, JACOB JEHIEL, DO On 02/25/2014. (Pt has high risk OB appt)    Specialty:  Family Medicine   Contact information:   [address] [place] [address] [phone]       Signed: Willodean Rosenthal M.D. 02/20/2014, 8:56 AM

## 2014-02-20 NOTE — Discharge Instructions (Signed)
Preterm Labor Information Preterm labor is when labor starts at less than 37 weeks of pregnancy. The normal length of a pregnancy is 39 to 41 weeks. CAUSES Often, there is no identifiable underlying cause as to why a woman goes into preterm labor. One of the most common known causes of preterm labor is infection. Infections of the uterus, cervix, vagina, amniotic sac, bladder, kidney, or even the lungs (pneumonia) can cause labor to start. Other suspected causes of preterm labor include:   Urogenital infections, such as yeast infections and bacterial vaginosis.   Uterine abnormalities (uterine shape, uterine septum, fibroids, or bleeding from the placenta).   A cervix that has been operated on (it may fail to stay closed).   Malformations in the fetus.   Multiple gestations (twins, triplets, and so on).   Breakage of the amniotic sac.  RISK FACTORS  Having a previous history of preterm labor.   Having premature rupture of membranes (PROM).   Having a placenta that covers the opening of the cervix (placenta previa).   Having a placenta that separates from the uterus (placental abruption).   Having a cervix that is too weak to hold the fetus in the uterus (incompetent cervix).   Having too much fluid in the amniotic sac (polyhydramnios).   Taking illegal drugs or smoking while pregnant.   Not gaining enough weight while pregnant.   Being younger than 36 and older than 30 years old.   Having a low socioeconomic status.   Being African American. SYMPTOMS Signs and symptoms of preterm labor include:   Menstrual-like cramps, abdominal pain, or back pain.  Uterine contractions that are regular, as frequent as six in an hour, regardless of their intensity (may be mild or painful).  Contractions that start on the top of the uterus and spread down to the lower abdomen and back.   A sense of increased pelvic pressure.   A watery or bloody mucus discharge that  comes from the vagina.  TREATMENT Depending on the length of the pregnancy and other circumstances, your health care provider may suggest bed rest. If necessary, there are medicines that can be given to stop contractions and to mature the fetal lungs. If labor happens before 34 weeks of pregnancy, a prolonged hospital stay may be recommended. Treatment depends on the condition of both you and the fetus.  WHAT SHOULD YOU DO IF YOU THINK YOU ARE IN PRETERM LABOR? Call your health care provider right away. You will need to go to the hospital to get checked immediately. HOW CAN YOU PREVENT PRETERM LABOR IN FUTURE PREGNANCIES? You should:   Stop smoking if you smoke.  Maintain healthy weight gain and avoid chemicals and drugs that are not necessary.  Be watchful for any type of infection.  Inform your health care provider if you have a known history of preterm labor. Document Released: 09/01/2003 Document Revised: 02/11/2013 Document Reviewed: 07/14/2012 [facility] Patient Information 2015 [place], [place]. This information is not intended to replace advice given to you by your health care provider. Make sure you discuss any questions you have with your health care provider. Cervical Insufficiency Cervical insufficiency is when the cervix is weak and starts to open (dilate) and thin (efface) before the pregnancy is at term and without labor starting. This is also called incompetent cervix. It can happen in the second or third trimester when the fetus starts putting pressure on the cervix. Cervical insufficiency can lead to a miscarriage, preterm premature rupture of the membranes (  PPROM), or having the baby early (preterm birth).  RISK FACTORS You may be more likely to develop cervical insufficiency if:  You have a shorter cervix than normal.  Damage or injury occurred to your cervix from a past pregnancy or surgery.  You were born with a cervical defect.  You have had a procedure done on the  cervix, such as cervical biopsy.  You have a history of cervical insufficiency.  You have a history of PPROM.  You have ended several past pregnancies through abortion.  You were exposed to the drug diethylstilbestrol (DES). SYMPTOMS Often times, women do not have any symptoms. Other times, women may only have mild symptoms that often start between week 14 through 20. The symptoms may last several days or weeks. These symptoms include:  Light spotting or bleeding from the vagina.  Pelvic pressure.  A change in vaginal discharge, such as discharge that changes from clear, white, or light yellow to pink or tan.  Back pain.  Abdominal pain or cramping. DIAGNOSIS Cervical insufficiency cannot be diagnosed before you become pregnant. Once you are pregnant, your health care provider will ask about your medical history and if you have had any problems in past pregnancies. Tell your health care provider about any procedures performed on your cervix or if you have a history of miscarriages or cervical insufficiency. If your health care provider thinks you are at high risk for cervical insufficiency or show signs of cervical insufficiency, he or she may:  Perform a pelvic exam. This will check for:  The presence of the membranes (amniotic sac) coming out of the cervix.  Cervical abnormalities.  Cervical injuries.  The presence of contractions.  Perform an ultrasonography (commonly called ultrasound) to measure the length and thickness of the cervix. TREATMENT If you have been diagnosed with cervical insufficiency, your health care provider may recommend:  Limiting physical activity.  Bed rest at home or in the hospital.  Pelvic rest, which means no sexual intercourse or placing anything in the vagina.  Cerclage to sew the cervix closed and prevent it from opening too early. The stitches (sutures) are removed between weeks 36 and 38 to avoid problems during labor. Cerclage may be  recommended during pregnancy if you have had a history of miscarriages or preterm births without a known cause. It may also be recommended if you have a short cervix that was identified by ultrasound or if your health care provider has found that your cervix has dilated before 24 weeks of pregnancy. Limiting physical activity and bed rest may or may not help prevent a preterm birth. WHEN SHOULD YOU SEEK IMMEDIATE MEDICAL CARE?  Seek immediate medical care if you show any symptoms of cervical insufficiency. You will need to go to the hospital to get checked immediately. Document Released: 06/11/2005 Document Revised: 10/26/2013 Document Reviewed: 08/18/2012 [facility] Patient Information 2015 [place], [place]. This information is not intended to replace advice given to you by your health care provider. Make sure you discuss any questions you have with your health care provider.

## 2014-02-20 NOTE — Progress Notes (Signed)
Discharge instructions given to patient she verbalizes understanding and using the teach back method she stated reason to return to the hospital.

## 2014-02-25 ENCOUNTER — Encounter: Payer: Self-pay | Admitting: Family Medicine

## 2014-02-25 ENCOUNTER — Ambulatory Visit (INDEPENDENT_AMBULATORY_CARE_PROVIDER_SITE_OTHER): Payer: Medicaid Other | Admitting: Family Medicine

## 2014-02-25 VITALS — BP 135/70 | HR 85 | Wt 177.9 lb

## 2014-02-25 DIAGNOSIS — O0990 Supervision of high risk pregnancy, unspecified, unspecified trimester: Secondary | ICD-10-CM

## 2014-02-25 DIAGNOSIS — Z23 Encounter for immunization: Secondary | ICD-10-CM

## 2014-02-25 DIAGNOSIS — O09891 Supervision of other high risk pregnancies, first trimester: Secondary | ICD-10-CM

## 2014-02-25 DIAGNOSIS — O0992 Supervision of high risk pregnancy, unspecified, second trimester: Secondary | ICD-10-CM

## 2014-02-25 DIAGNOSIS — O09219 Supervision of pregnancy with history of pre-term labor, unspecified trimester: Secondary | ICD-10-CM

## 2014-02-25 DIAGNOSIS — O09211 Supervision of pregnancy with history of pre-term labor, first trimester: Secondary | ICD-10-CM

## 2014-02-25 LAB — POCT URINALYSIS DIP (DEVICE)
Bilirubin Urine: NEGATIVE
GLUCOSE, UA: NEGATIVE mg/dL
Hgb urine dipstick: NEGATIVE
Ketones, ur: NEGATIVE mg/dL
NITRITE: NEGATIVE
Protein, ur: NEGATIVE mg/dL
SPECIFIC GRAVITY, URINE: 1.015 (ref 1.005–1.030)
UROBILINOGEN UA: 0.2 mg/dL (ref 0.0–1.0)
pH: 7 (ref 5.0–8.0)

## 2014-02-25 LAB — CBC
HCT: 34.8 % — ABNORMAL LOW (ref 36.0–46.0)
Hemoglobin: 11.6 g/dL — ABNORMAL LOW (ref 12.0–15.0)
MCH: 26 pg (ref 26.0–34.0)
MCHC: 33.3 g/dL (ref 30.0–36.0)
MCV: 77.9 fL — ABNORMAL LOW (ref 78.0–100.0)
Platelets: 320 10*3/uL (ref 150–400)
RBC: 4.47 MIL/uL (ref 3.87–5.11)
RDW: 15.9 % — ABNORMAL HIGH (ref 11.5–15.5)
WBC: 16.1 10*3/uL — AB (ref 4.0–10.5)

## 2014-02-25 MED ORDER — TETANUS-DIPHTH-ACELL PERTUSSIS 5-2.5-18.5 LF-MCG/0.5 IM SUSP
0.5000 mL | Freq: Once | INTRAMUSCULAR | Status: AC
  Administered 2014-02-25: 0.5 mL via INTRAMUSCULAR

## 2014-02-25 NOTE — Progress Notes (Signed)
No reactions to 17-OH-P.  Was admitted end of August for funneling of cervix.  Was 2cm dilated with stable exam and discharged.  No contractions, but having some pressure.  Denies decreased fetal activity, bleeding, discharge, leaking fluid, headache.

## 2014-02-25 NOTE — Progress Notes (Signed)
Patient reports pelvic pressure and lower back pain  

## 2014-02-25 NOTE — Patient Instructions (Signed)
Preterm Labor Information Preterm labor is when labor starts before you are [redacted] weeks pregnant. The normal length of pregnancy is 39 to 41 weeks.  CAUSES  The cause of preterm labor is not often known. The most common known cause is infection. RISK FACTORS  Having a history of preterm labor.  Having your water break before it should.  Having a placenta that covers the opening of the cervix.  Having a placenta that breaks away from the uterus.  Having a cervix that is too weak to hold the baby in the uterus.  Having too much fluid in the amniotic sac.  Taking drugs or smoking while pregnant.  Not gaining enough weight while pregnant.  Being younger than 18 and older than 30 years old.  Having a low income.  Being African American. SYMPTOMS  Period-like cramps, belly (abdominal) pain, or back pain.  Contractions that are regular, as often as six in an hour. They may be mild or painful.  Contractions that start at the top of the belly. They then move to the lower belly and back.  Lower belly pressure that seems to get stronger.  Bleeding from the vagina.  Fluid leaking from the vagina. TREATMENT  Treatment depends on:  Your condition.  The condition of your baby.  How many weeks pregnant you are. Your doctor may have you:  Take medicine to stop contractions.  Stay in bed except to use the restroom (bed rest).  Stay in the hospital. WHAT SHOULD YOU DO IF YOU THINK YOU ARE IN PRETERM LABOR? Call your doctor right away. You need to go to the hospital right away.  HOW CAN YOU PREVENT PRETERM LABOR IN FUTURE PREGNANCIES?  Stop smoking, if you smoke.  Maintain healthy weight gain.  Do not take drugs or be around chemicals that are not needed.  Tell your doctor if you think you have an infection.  Tell your doctor if you had a preterm labor before. Document Released: 09/07/2008 Document Revised: 04/01/2013 Document Reviewed: 09/07/2008 [employer] Patient  Information 2015 [employer]. This information is not intended to replace advice given to you by your health care provider. Make sure you discuss any questions you have with your health care provider.  

## 2014-02-26 LAB — GLUCOSE TOLERANCE, 1 HOUR (50G) W/O FASTING: Glucose, 1 Hour GTT: 111 mg/dL (ref 70–140)

## 2014-02-26 LAB — RPR

## 2014-02-26 LAB — HIV ANTIBODY (ROUTINE TESTING W REFLEX): HIV: NONREACTIVE

## 2014-03-04 ENCOUNTER — Ambulatory Visit (INDEPENDENT_AMBULATORY_CARE_PROVIDER_SITE_OTHER): Payer: Medicaid Other | Admitting: *Deleted

## 2014-03-04 VITALS — BP 127/80 | HR 86 | Temp 98.4°F

## 2014-03-04 DIAGNOSIS — O09213 Supervision of pregnancy with history of pre-term labor, third trimester: Principal | ICD-10-CM

## 2014-03-04 DIAGNOSIS — O09219 Supervision of pregnancy with history of pre-term labor, unspecified trimester: Secondary | ICD-10-CM

## 2014-03-04 DIAGNOSIS — Z23 Encounter for immunization: Secondary | ICD-10-CM

## 2014-03-04 DIAGNOSIS — O09893 Supervision of other high risk pregnancies, third trimester: Secondary | ICD-10-CM

## 2014-03-04 MED ORDER — HYDROXYPROGESTERONE CAPROATE 250 MG/ML IM OIL
250.0000 mg | TOPICAL_OIL | INTRAMUSCULAR | Status: DC
Start: 2014-03-04 — End: 2014-03-12
  Administered 2014-03-04: 250 mg via INTRAMUSCULAR

## 2014-03-09 ENCOUNTER — Inpatient Hospital Stay (HOSPITAL_COMMUNITY)
Admission: AD | Admit: 2014-03-09 | Discharge: 2014-03-12 | DRG: 765 | Disposition: A | Discharge status: Home / Self Care | Payer: Medicaid Other | Source: Ambulatory Visit | Attending: Family Medicine | Admitting: Family Medicine

## 2014-03-09 ENCOUNTER — Encounter (HOSPITAL_COMMUNITY)
Admission: AD | Disposition: A | Discharge status: Home / Self Care | Payer: Self-pay | Source: Ambulatory Visit | Attending: Family Medicine

## 2014-03-09 ENCOUNTER — Inpatient Hospital Stay (HOSPITAL_COMMUNITY): Payer: Medicaid Other | Admitting: Anesthesiology

## 2014-03-09 ENCOUNTER — Encounter (HOSPITAL_COMMUNITY): Payer: Self-pay

## 2014-03-09 ENCOUNTER — Encounter (HOSPITAL_COMMUNITY): Payer: Medicaid Other | Admitting: Anesthesiology

## 2014-03-09 DIAGNOSIS — O09213 Supervision of pregnancy with history of pre-term labor, third trimester: Secondary | ICD-10-CM

## 2014-03-09 DIAGNOSIS — O9903 Anemia complicating the puerperium: Secondary | ICD-10-CM | POA: Diagnosis present

## 2014-03-09 DIAGNOSIS — O3430 Maternal care for cervical incompetence, unspecified trimester: Secondary | ICD-10-CM | POA: Diagnosis present

## 2014-03-09 DIAGNOSIS — D62 Acute posthemorrhagic anemia: Secondary | ICD-10-CM | POA: Diagnosis present

## 2014-03-09 DIAGNOSIS — Z833 Family history of diabetes mellitus: Secondary | ICD-10-CM

## 2014-03-09 DIAGNOSIS — O4703 False labor before 37 completed weeks of gestation, third trimester: Secondary | ICD-10-CM

## 2014-03-09 DIAGNOSIS — Z789 Other specified health status: Secondary | ICD-10-CM

## 2014-03-09 DIAGNOSIS — O34219 Maternal care for unspecified type scar from previous cesarean delivery: Secondary | ICD-10-CM | POA: Diagnosis present

## 2014-03-09 DIAGNOSIS — O321XX0 Maternal care for breech presentation, not applicable or unspecified: Secondary | ICD-10-CM | POA: Diagnosis present

## 2014-03-09 DIAGNOSIS — Z98891 History of uterine scar from previous surgery: Secondary | ICD-10-CM

## 2014-03-09 DIAGNOSIS — O09893 Supervision of other high risk pregnancies, third trimester: Secondary | ICD-10-CM

## 2014-03-09 DIAGNOSIS — O4690 Antepartum hemorrhage, unspecified, unspecified trimester: Secondary | ICD-10-CM | POA: Diagnosis present

## 2014-03-09 LAB — URINALYSIS, ROUTINE W REFLEX MICROSCOPIC
Bilirubin Urine: NEGATIVE
Glucose, UA: NEGATIVE mg/dL
KETONES UR: NEGATIVE mg/dL
Nitrite: NEGATIVE
PROTEIN: NEGATIVE mg/dL
Specific Gravity, Urine: 1.015 (ref 1.005–1.030)
UROBILINOGEN UA: 0.2 mg/dL (ref 0.0–1.0)
pH: 7 (ref 5.0–8.0)

## 2014-03-09 LAB — RPR

## 2014-03-09 LAB — URINE MICROSCOPIC-ADD ON

## 2014-03-09 LAB — CBC
HCT: 35.3 % — ABNORMAL LOW (ref 36.0–46.0)
Hemoglobin: 12.1 g/dL (ref 12.0–15.0)
MCH: 26.8 pg (ref 26.0–34.0)
MCHC: 34.3 g/dL (ref 30.0–36.0)
MCV: 78.1 fL (ref 78.0–100.0)
PLATELETS: 277 10*3/uL (ref 150–400)
RBC: 4.52 MIL/uL (ref 3.87–5.11)
RDW: 14.8 % (ref 11.5–15.5)
WBC: 18.4 10*3/uL — ABNORMAL HIGH (ref 4.0–10.5)

## 2014-03-09 LAB — TYPE AND SCREEN
ABO/RH(D): O POS
Antibody Screen: NEGATIVE

## 2014-03-09 LAB — RAPID HIV SCREEN (WH-MAU): Rapid HIV Screen: NONREACTIVE

## 2014-03-09 SURGERY — Surgical Case
Anesthesia: Spinal

## 2014-03-09 MED ORDER — NALOXONE HCL 0.4 MG/ML IJ SOLN: 0.4000 mg | INTRAMUSCULAR | Status: DC | PRN

## 2014-03-09 MED ORDER — PHENYLEPHRINE 8 MG IN D5W 100 ML (0.08MG/ML) PREMIX OPTIME: INJECTION | INTRAVENOUS | Status: DC | PRN

## 2014-03-09 MED ORDER — OXYCODONE-ACETAMINOPHEN 5-325 MG PO TABS: 2.0000 | ORAL_TABLET | ORAL | Status: DC | PRN

## 2014-03-09 MED ORDER — PHENYLEPHRINE 8 MG IN D5W 100 ML (0.08MG/ML) PREMIX OPTIME
INJECTION | INTRAVENOUS | Status: AC
  Filled 2014-03-09: qty 100

## 2014-03-09 MED ORDER — CEFAZOLIN SODIUM-DEXTROSE 2-3 GM-% IV SOLR
INTRAVENOUS | Status: DC | PRN
  Administered 2014-03-09: 2 g via INTRAVENOUS

## 2014-03-09 MED ORDER — MENTHOL 3 MG MT LOZG
1.0000 | LOZENGE | OROMUCOSAL | Status: DC | PRN
Start: 2014-03-09 — End: 2014-03-12

## 2014-03-09 MED ORDER — INDOMETHACIN 25 MG PO CAPS
25.0000 mg | ORAL_CAPSULE | Freq: Four times a day (QID) | ORAL | Status: DC
  Filled 2014-03-09: qty 1

## 2014-03-09 MED ORDER — CEFAZOLIN SODIUM-DEXTROSE 2-3 GM-% IV SOLR
2.0000 g | Freq: Once | INTRAVENOUS | Status: DC
  Filled 2014-03-09: qty 50

## 2014-03-09 MED ORDER — ONDANSETRON HCL 4 MG/2ML IJ SOLN: 4.0000 mg | Freq: Three times a day (TID) | INTRAMUSCULAR | Status: DC | PRN

## 2014-03-09 MED ORDER — WITCH HAZEL-GLYCERIN EX PADS: 1.0000 "application " | MEDICATED_PAD | CUTANEOUS | Status: DC | PRN

## 2014-03-09 MED ORDER — DIPHENHYDRAMINE HCL 50 MG/ML IJ SOLN: 25.0000 mg | INTRAMUSCULAR | Status: DC | PRN

## 2014-03-09 MED ORDER — ONDANSETRON HCL 4 MG/2ML IJ SOLN: 4.0000 mg | INTRAMUSCULAR | Status: DC | PRN

## 2014-03-09 MED ORDER — MORPHINE SULFATE 0.5 MG/ML IJ SOLN
INTRAMUSCULAR | Status: AC
  Filled 2014-03-09: qty 10

## 2014-03-09 MED ORDER — LACTATED RINGERS IV SOLN
INTRAVENOUS | Status: DC | PRN
  Administered 2014-03-09: 12:00:00 via INTRAVENOUS

## 2014-03-09 MED ORDER — NALOXONE HCL 1 MG/ML IJ SOLN
1.0000 ug/kg/h | INTRAVENOUS | Status: DC | PRN
  Filled 2014-03-09: qty 2

## 2014-03-09 MED ORDER — ACETAMINOPHEN 325 MG PO TABS: 650.0000 mg | ORAL_TABLET | ORAL | Status: DC | PRN

## 2014-03-09 MED ORDER — KETOROLAC TROMETHAMINE 30 MG/ML IJ SOLN
30.0000 mg | Freq: Four times a day (QID) | INTRAMUSCULAR | Status: AC | PRN
  Administered 2014-03-09: 30 mg via INTRAMUSCULAR

## 2014-03-09 MED ORDER — PRENATAL MULTIVITAMIN CH
1.0000 | ORAL_TABLET | Freq: Every day | ORAL | Status: DC
  Administered 2014-03-09 – 2014-03-12 (×4): 1 via ORAL
  Filled 2014-03-09 (×3): qty 1

## 2014-03-09 MED ORDER — OXYCODONE-ACETAMINOPHEN 5-325 MG PO TABS: 1.0000 | ORAL_TABLET | ORAL | Status: DC | PRN

## 2014-03-09 MED ORDER — BUPIVACAINE HCL (PF) 0.25 % IJ SOLN
INTRAMUSCULAR | Status: AC
  Filled 2014-03-09: qty 30

## 2014-03-09 MED ORDER — LACTATED RINGERS IV SOLN: 500.0000 mL | INTRAVENOUS | Status: DC | PRN

## 2014-03-09 MED ORDER — METOCLOPRAMIDE HCL 5 MG/ML IJ SOLN: 10.0000 mg | Freq: Three times a day (TID) | INTRAMUSCULAR | Status: DC | PRN

## 2014-03-09 MED ORDER — ONDANSETRON HCL 4 MG/2ML IJ SOLN: 4.0000 mg | Freq: Four times a day (QID) | INTRAMUSCULAR | Status: DC | PRN

## 2014-03-09 MED ORDER — CITRIC ACID-SODIUM CITRATE 334-500 MG/5ML PO SOLN
30.0000 mL | ORAL | Status: DC | PRN
  Administered 2014-03-09: 30 mL via ORAL
  Filled 2014-03-09: qty 15

## 2014-03-09 MED ORDER — PROGESTERONE MICRONIZED 200 MG PO CAPS: 200.0000 mg | ORAL_CAPSULE | Freq: Every day | ORAL | Status: DC

## 2014-03-09 MED ORDER — HYDROXYPROGESTERONE CAPROATE 250 MG/ML IM OIL: 250.0000 mg | TOPICAL_OIL | INTRAMUSCULAR | Status: DC

## 2014-03-09 MED ORDER — TETANUS-DIPHTH-ACELL PERTUSSIS 5-2.5-18.5 LF-MCG/0.5 IM SUSP
0.5000 mL | Freq: Once | INTRAMUSCULAR | Status: DC
Start: 2014-03-10 — End: 2014-03-09

## 2014-03-09 MED ORDER — FENTANYL CITRATE 0.05 MG/ML IJ SOLN
50.0000 ug | INTRAMUSCULAR | Status: DC | PRN
  Filled 2014-03-09: qty 2

## 2014-03-09 MED ORDER — MAGNESIUM SULFATE 40 G IN LACTATED RINGERS - SIMPLE
2.0000 g/h | INTRAVENOUS | Status: DC
  Filled 2014-03-09: qty 500

## 2014-03-09 MED ORDER — OXYTOCIN 40 UNITS IN LACTATED RINGERS INFUSION - SIMPLE MED: 62.5000 mL/h | INTRAVENOUS | Status: AC

## 2014-03-09 MED ORDER — DIPHENHYDRAMINE HCL 25 MG PO CAPS
25.0000 mg | ORAL_CAPSULE | ORAL | Status: DC | PRN
  Administered 2014-03-10: 25 mg via ORAL
  Filled 2014-03-09 (×2): qty 1

## 2014-03-09 MED ORDER — OXYTOCIN 40 UNITS IN LACTATED RINGERS INFUSION - SIMPLE MED: 62.5000 mL/h | INTRAVENOUS | Status: DC

## 2014-03-09 MED ORDER — ZOLPIDEM TARTRATE 5 MG PO TABS: 5.0000 mg | ORAL_TABLET | Freq: Every evening | ORAL | Status: DC | PRN

## 2014-03-09 MED ORDER — DIPHENHYDRAMINE HCL 50 MG/ML IJ SOLN: 12.5000 mg | INTRAMUSCULAR | Status: DC | PRN

## 2014-03-09 MED ORDER — PRENATAL MULTIVITAMIN CH: 1.0000 | ORAL_TABLET | Freq: Every day | ORAL | Status: DC

## 2014-03-09 MED ORDER — DIPHENHYDRAMINE HCL 25 MG PO CAPS: 25.0000 mg | ORAL_CAPSULE | Freq: Four times a day (QID) | ORAL | Status: DC | PRN

## 2014-03-09 MED ORDER — LACTATED RINGERS IV SOLN: INTRAVENOUS | Status: DC

## 2014-03-09 MED ORDER — KETOROLAC TROMETHAMINE 30 MG/ML IJ SOLN
INTRAMUSCULAR | Status: AC
  Administered 2014-03-09: 30 mg via INTRAMUSCULAR
  Filled 2014-03-09: qty 1

## 2014-03-09 MED ORDER — ONDANSETRON HCL 4 MG/2ML IJ SOLN
INTRAMUSCULAR | Status: DC | PRN
  Administered 2014-03-09: 4 mg via INTRAVENOUS

## 2014-03-09 MED ORDER — LANOLIN HYDROUS EX OINT: 1.0000 "application " | TOPICAL_OINTMENT | CUTANEOUS | Status: DC | PRN

## 2014-03-09 MED ORDER — MAGNESIUM SULFATE BOLUS VIA INFUSION
4.0000 g | Freq: Once | INTRAVENOUS | Status: AC
Start: 2014-03-09 — End: 2014-03-09
  Administered 2014-03-09: 4 g via INTRAVENOUS
  Filled 2014-03-09: qty 500

## 2014-03-09 MED ORDER — SIMETHICONE 80 MG PO CHEW: 80.0000 mg | CHEWABLE_TABLET | ORAL | Status: DC | PRN

## 2014-03-09 MED ORDER — SODIUM CHLORIDE 0.9 % IJ SOLN: 3.0000 mL | INTRAMUSCULAR | Status: DC | PRN

## 2014-03-09 MED ORDER — SENNOSIDES-DOCUSATE SODIUM 8.6-50 MG PO TABS
2.0000 | ORAL_TABLET | ORAL | Status: DC
Start: 2014-03-10 — End: 2014-03-12
  Administered 2014-03-10 – 2014-03-11 (×2): 2 via ORAL
  Filled 2014-03-09 (×2): qty 2

## 2014-03-09 MED ORDER — LACTATED RINGERS IV SOLN
40.0000 [IU] | INTRAVENOUS | Status: DC | PRN
  Administered 2014-03-09: 40 [IU] via INTRAVENOUS

## 2014-03-09 MED ORDER — SCOPOLAMINE 1 MG/3DAYS TD PT72
1.0000 | MEDICATED_PATCH | Freq: Once | TRANSDERMAL | Status: AC
  Administered 2014-03-09: 1.5 mg via TRANSDERMAL

## 2014-03-09 MED ORDER — FENTANYL CITRATE 0.05 MG/ML IJ SOLN: 100.0000 ug | INTRAMUSCULAR | Status: DC | PRN

## 2014-03-09 MED ORDER — LACTATED RINGERS IV SOLN
INTRAVENOUS | Status: DC
  Administered 2014-03-09: 20:00:00 via INTRAVENOUS

## 2014-03-09 MED ORDER — FENTANYL CITRATE 0.05 MG/ML IJ SOLN
50.0000 ug | INTRAMUSCULAR | Status: DC | PRN
  Administered 2014-03-09 (×2): 50 ug via INTRAVENOUS
  Filled 2014-03-09: qty 2

## 2014-03-09 MED ORDER — ONDANSETRON HCL 4 MG/2ML IJ SOLN
INTRAMUSCULAR | Status: AC
  Filled 2014-03-09: qty 2

## 2014-03-09 MED ORDER — IBUPROFEN 600 MG PO TABS
600.0000 mg | ORAL_TABLET | Freq: Four times a day (QID) | ORAL | Status: DC
  Administered 2014-03-09 – 2014-03-12 (×11): 600 mg via ORAL
  Filled 2014-03-09 (×11): qty 1

## 2014-03-09 MED ORDER — SCOPOLAMINE 1 MG/3DAYS TD PT72
MEDICATED_PATCH | TRANSDERMAL | Status: AC
  Filled 2014-03-09: qty 1

## 2014-03-09 MED ORDER — LACTATED RINGERS IV SOLN
INTRAVENOUS | Status: DC | PRN
  Administered 2014-03-09 (×3): via INTRAVENOUS

## 2014-03-09 MED ORDER — NALBUPHINE HCL 10 MG/ML IJ SOLN: 5.0000 mg | INTRAMUSCULAR | Status: DC | PRN

## 2014-03-09 MED ORDER — CALCIUM CARBONATE ANTACID 500 MG PO CHEW: 2.0000 | CHEWABLE_TABLET | ORAL | Status: DC | PRN

## 2014-03-09 MED ORDER — SIMETHICONE 80 MG PO CHEW
80.0000 mg | CHEWABLE_TABLET | ORAL | Status: DC
Start: 2014-03-10 — End: 2014-03-12
  Administered 2014-03-10 – 2014-03-11 (×2): 80 mg via ORAL
  Filled 2014-03-09 (×2): qty 1

## 2014-03-09 MED ORDER — DOCUSATE SODIUM 100 MG PO CAPS
100.0000 mg | ORAL_CAPSULE | Freq: Every day | ORAL | Status: DC
  Administered 2014-03-09: 100 mg via ORAL
  Filled 2014-03-09: qty 1

## 2014-03-09 MED ORDER — FENTANYL CITRATE 0.05 MG/ML IJ SOLN: 25.0000 ug | INTRAMUSCULAR | Status: DC | PRN

## 2014-03-09 MED ORDER — CEFAZOLIN SODIUM-DEXTROSE 2-3 GM-% IV SOLR
INTRAVENOUS | Status: AC
  Filled 2014-03-09: qty 50

## 2014-03-09 MED ORDER — LIDOCAINE HCL (CARDIAC) 20 MG/ML IV SOLN
INTRAVENOUS | Status: AC
  Filled 2014-03-09: qty 5

## 2014-03-09 MED ORDER — MORPHINE SULFATE (PF) 0.5 MG/ML IJ SOLN
INTRAMUSCULAR | Status: DC | PRN
  Administered 2014-03-09: .1 mg via INTRATHECAL

## 2014-03-09 MED ORDER — KETOROLAC TROMETHAMINE 30 MG/ML IJ SOLN: 30.0000 mg | Freq: Four times a day (QID) | INTRAMUSCULAR | Status: AC | PRN

## 2014-03-09 MED ORDER — MEPERIDINE HCL 25 MG/ML IJ SOLN: 6.2500 mg | INTRAMUSCULAR | Status: DC | PRN

## 2014-03-09 MED ORDER — OXYTOCIN BOLUS FROM INFUSION: 500.0000 mL | INTRAVENOUS | Status: DC

## 2014-03-09 MED ORDER — SIMETHICONE 80 MG PO CHEW
80.0000 mg | CHEWABLE_TABLET | Freq: Three times a day (TID) | ORAL | Status: DC
  Administered 2014-03-09 – 2014-03-12 (×8): 80 mg via ORAL
  Filled 2014-03-09 (×8): qty 1

## 2014-03-09 MED ORDER — LACTATED RINGERS IV SOLN
INTRAVENOUS | Status: DC
  Administered 2014-03-09: 07:00:00 via INTRAVENOUS

## 2014-03-09 MED ORDER — NALBUPHINE HCL 10 MG/ML IJ SOLN
5.0000 mg | INTRAMUSCULAR | Status: DC | PRN
  Administered 2014-03-10: 10 mg via INTRAVENOUS
  Filled 2014-03-09: qty 1

## 2014-03-09 MED ORDER — ONDANSETRON HCL 4 MG PO TABS: 4.0000 mg | ORAL_TABLET | ORAL | Status: DC | PRN

## 2014-03-09 MED ORDER — EPHEDRINE 5 MG/ML INJ
INTRAVENOUS | Status: AC
  Filled 2014-03-09: qty 10

## 2014-03-09 MED ORDER — OXYTOCIN 10 UNIT/ML IJ SOLN
INTRAMUSCULAR | Status: AC
  Filled 2014-03-09: qty 4

## 2014-03-09 MED ORDER — INDOMETHACIN 50 MG PO CAPS
50.0000 mg | ORAL_CAPSULE | Freq: Once | ORAL | Status: AC
Start: 2014-03-09 — End: 2014-03-09
  Administered 2014-03-09: 50 mg via ORAL
  Filled 2014-03-09: qty 1

## 2014-03-09 MED ORDER — FENTANYL CITRATE 0.05 MG/ML IJ SOLN
INTRAMUSCULAR | Status: AC
  Filled 2014-03-09: qty 2

## 2014-03-09 MED ORDER — DIBUCAINE 1 % RE OINT: 1.0000 "application " | TOPICAL_OINTMENT | RECTAL | Status: DC | PRN

## 2014-03-09 MED ORDER — SODIUM CHLORIDE 0.9 % IJ SOLN
INTRAMUSCULAR | Status: AC
  Filled 2014-03-09: qty 50

## 2014-03-09 MED ORDER — PHENYLEPHRINE 8 MG IN D5W 100 ML (0.08MG/ML) PREMIX OPTIME
INJECTION | INTRAVENOUS | Status: DC | PRN
Start: 2014-03-09 — End: 2014-03-09
  Administered 2014-03-09: 60 ug/min via INTRAVENOUS

## 2014-03-09 SURGICAL SUPPLY — 40 items
BENZOIN TINCTURE PRP APPL 2/3 (GAUZE/BANDAGES/DRESSINGS) ×3 IMPLANT
BLADE SURG 10 STRL SS (BLADE) ×6 IMPLANT
CATH ROBINSON RED A/P 16FR (CATHETERS) IMPLANT
CLAMP CORD UMBIL (MISCELLANEOUS) IMPLANT
CLOSURE WOUND 1/2 X4 (GAUZE/BANDAGES/DRESSINGS) ×1
CLOTH BEACON ORANGE TIMEOUT ST (SAFETY) ×3 IMPLANT
DRAPE LG THREE QUARTER DISP (DRAPES) IMPLANT
DRSG OPSITE POSTOP 4X10 (GAUZE/BANDAGES/DRESSINGS) ×3 IMPLANT
DRSG TELFA 3X8 NADH (GAUZE/BANDAGES/DRESSINGS) ×3 IMPLANT
DURAPREP 26ML APPLICATOR (WOUND CARE) ×3 IMPLANT
ELECT REM PT RETURN 9FT ADLT (ELECTROSURGICAL) ×3
ELECTRODE REM PT RTRN 9FT ADLT (ELECTROSURGICAL) ×1 IMPLANT
EXTRACTOR VACUUM M CUP 4 TUBE (SUCTIONS) IMPLANT
EXTRACTOR VACUUM M CUP 4' TUBE (SUCTIONS)
GLOVE BIOGEL PI IND STRL 7.5 (GLOVE) ×2 IMPLANT
GLOVE BIOGEL PI INDICATOR 7.5 (GLOVE) ×4
GLOVE ECLIPSE 7.5 STRL STRAW (GLOVE) ×3 IMPLANT
GOWN STRL REUS W/TWL LRG LVL3 (GOWN DISPOSABLE) ×9 IMPLANT
KIT ABG SYR 3ML LUER SLIP (SYRINGE) IMPLANT
NEEDLE HYPO 22GX1.5 SAFETY (NEEDLE) ×3 IMPLANT
NEEDLE HYPO 25X5/8 SAFETYGLIDE (NEEDLE) IMPLANT
NS IRRIG 1000ML POUR BTL (IV SOLUTION) ×3 IMPLANT
PACK C SECTION WH (CUSTOM PROCEDURE TRAY) ×3 IMPLANT
PAD ABD DERMACEA PRESS 5X9 (GAUZE/BANDAGES/DRESSINGS) ×3 IMPLANT
PAD OB MATERNITY 4.3X12.25 (PERSONAL CARE ITEMS) ×3 IMPLANT
RTRCTR C-SECT PINK 25CM LRG (MISCELLANEOUS) IMPLANT
SEPRAFILM MEMBRANE 5X6 (MISCELLANEOUS) ×6 IMPLANT
STRIP CLOSURE SKIN 1/2X4 (GAUZE/BANDAGES/DRESSINGS) ×2 IMPLANT
SUT MNCRL 0 VIOLET CTX 36 (SUTURE) IMPLANT
SUT MON AB 2-0 CT1 27 (SUTURE) ×9 IMPLANT
SUT MON AB 3-0 SH 27 (SUTURE) ×2
SUT MON AB 3-0 SH27 (SUTURE) ×1 IMPLANT
SUT MONOCRYL 0 CTX 36 (SUTURE)
SUT VIC AB 0 CTX 36 (SUTURE) ×6
SUT VIC AB 0 CTX36XBRD ANBCTRL (SUTURE) ×3 IMPLANT
SUT VIC AB 4-0 KS 27 (SUTURE) ×3 IMPLANT
SYR 30ML LL (SYRINGE) ×3 IMPLANT
TOWEL OR 17X24 6PK STRL BLUE (TOWEL DISPOSABLE) ×3 IMPLANT
TRAY FOLEY CATH 14FR (SET/KITS/TRAYS/PACK) ×3 IMPLANT
WATER STERILE IRR 1000ML POUR (IV SOLUTION) ×3 IMPLANT

## 2014-03-09 NOTE — MAU Note (Signed)
Pt reports vaginal bleeding and back pain.

## 2014-03-09 NOTE — H&P (Signed)
History     Chief Complaint   Patient presents with   .  Vaginal Bleeding   .  Back Pain    HPI  Ms. Brittany Perez is a 28 y.o. [id number] at [redacted]w[redacted]d who presents to MAU today with complaint of vaginal bleeding since last night. She states that she has only noted blood with wiping and not needed a pad. She states moderate pelvic cramping and low back pain as well. She denies LOF. She was admitted to [facility] ~ 2 weeks ago for cervical incompetance. She was 2 cm dilated and stable at time of discharge. She received Mg and Betamethasone during that admission. She reports good fetal movement.   OB History    Grav  Para  Term  Preterm  Abortions  TAB  SAB  Ect  Mult  Living    0  0  0  0  0  2      Past Medical History   Diagnosis  Date   .  Preterm labor     Past Surgical History   Procedure  Laterality  Date   .  Cesarean section      Family History   Problem  Relation  Age of Onset   .  Diabetes  Mother     History   Substance Use Topics   .  Smoking status:  Never Smoker   .  Smokeless tobacco:  Never Used   .  Alcohol Use:  No    Allergies: No Known Allergies  Facility-administered medications prior to admission   Medication  Dose  Route  Frequency  Provider  Last Rate  Last Dose   .  hydroxyprogesterone caproate (DELALUTIN) 250 mg/mL injection 250 mg  250 mg  Intramuscular  Weekly  Nylia Gavina A Ronda Kazmi, MD   250 mg at 03/04/14 1016    Prescriptions prior to admission   Medication  Sig  Dispense  Refill   .  docusate sodium 100 MG CAPS  Take 100 mg by mouth daily.  10 capsule  0   .  Prenatal Vit-Fe Fumarate-FA (PRENATAL MULTIVITAMIN) TABS tablet  Take 1 tablet by mouth daily at 12 noon.     .  progesterone (PROMETRIUM) 200 MG capsule  Place 1 capsule (200 mg total) vaginally at bedtime.  30 capsule  3    Review of Systems  Constitutional: Negative for fever and malaise/fatigue.  Gastrointestinal: Positive for abdominal pain.  Genitourinary:  + vaginal bleeding  Neg  - vaginal discharge, LOF  Musculoskeletal: Positive for back pain.   Physical Exam   Blood pressure 133/79, pulse 113, temperature 98.8 F (37.1 C), temperature source Oral, resp. rate 20, height  (1.6 m), weight 179 lb (81.194 kg), last menstrual period 08/23/2013, SpO2 100.00%.  Physical Exam  Constitutional: She is oriented to person, place, and time. She appears well-developed and well-nourished. No distress.  HENT:  Head: Normocephalic.  Cardiovascular: Normal rate.  Respiratory: Effort normal.  GI: Soft. She exhibits no distension and no mass. There is no tenderness. There is no rebound and no guarding.  Neurological: She is alert and oriented to person, place, and time.  Skin: Skin is warm and dry. No erythema.  Psychiatric: She has a normal mood and affect.  Dilation: 5 (BBOW)  Effacement (%): 100  Presentation: Complete Breech  Exam by:: Dr Macon Large  Results for orders placed during the hospital encounter of 03/09/14 (from the past 24  hour(s))   URINALYSIS, ROUTINE W REFLEX MICROSCOPIC Status: Abnormal    Collection Time    03/09/14 6:21 AM   Result  Value  Ref Range    Color, Urine  YELLOW  YELLOW    APPearance  CLOUDY (*)  CLEAR    Specific Gravity, Urine  1.015  1.005 - 1.030    pH  7.0  5.0 - 8.0    Glucose, UA  NEGATIVE  NEGATIVE mg/dL    Hgb urine dipstick  LARGE (*)  NEGATIVE    Bilirubin Urine  NEGATIVE  NEGATIVE    Ketones, ur  NEGATIVE  NEGATIVE mg/dL    Protein, ur  NEGATIVE  NEGATIVE mg/dL    Urobilinogen, UA  0.2  0.0 - 1.0 mg/dL    Nitrite  NEGATIVE  NEGATIVE    Leukocytes, UA  MODERATE (*)  NEGATIVE   URINE MICROSCOPIC-ADD ON Status: Abnormal    Collection Time    03/09/14 6:21 AM   Result  Value  Ref Range    Squamous Epithelial / LPF  MANY (*)  RARE    WBC, UA  21-50  <3 WBC/hpf    RBC / HPF  21-50  <3 RBC/hpf    Bacteria, UA  FEW (*)  RARE    MAU Course   Procedures  None  MDM  UA today  Attempted to check patient's cervix, ?  4-5/100%/breech  Called Dr. Macon Large to come and re-check cervix. She is in MAU to assess and admit the patient.  Assessment and Plan   A:  SIUP at [redacted]w[redacted]d  Preterm labor  Cervical incompetance  P:  Admit to L&D  Marny Lowenstein, PA-C  03/09/2014, 7:04 AM    Attestation of Attending Supervision of Advanced Practitioner (PA/CNM/NP): Evaluation and management procedures were performed by the Advanced Practitioner under my supervision and collaboration. I have reviewed the Advanced Practitioner's note and chart, and I agree with the management and plan.   On my exam, patient is 5/100/BBOW/fetal legs palpated. Complete breech presentation confirmed on bedside scan. Discussed need for inpatient admission, tocolysis. She received BMZ 8/25 and 8/26. Will admit to L&D and start tocolysis with magnesium sulfate and indomethacin. Strict bedrest for patient.   Patient had a previous cesarean section and has breech presentation, understands that she will need a RCS for unstoppable labor or other indication for delivery. Will sign consent with patient once interpreter [facility]) is available. Neonatology team aware of this patient's admission. Anesthesiology team/OR also aware.    Jaynie Collins, MD, FACOG  Attending Obstetrician & Gynecologist  Faculty Practice, [facility]

## 2014-03-09 NOTE — Op Note (Addendum)
Prerna Gallacher PROCEDURE DATE: 03/09/2014  PREOPERATIVE DIAGNOSIS: Intrauterine pregnancy at  [redacted]w[redacted]d weeks gestation; malpresentation: breech and labor  POSTOPERATIVE DIAGNOSIS: The same  PROCEDURE: Repeat Classical Cesarean Section  SURGEON:  Dr. Candelaria Celeste  ASSISTANT: Dr Elsie Lincoln  INDICATIONS: Brittany Perez is a 28 y.o. [id number] at [redacted]w[redacted]d scheduled for cesarean section secondary to malpresentation: breech and Labor.  The risks of cesarean section discussed with the patient included but were not limited to: bleeding which may require transfusion or reoperation; infection which may require antibiotics; injury to bowel, bladder, ureters or other surrounding organs; injury to the fetus; need for additional procedures including hysterectomy in the event of a life-threatening hemorrhage; placental abnormalities wth subsequent pregnancies, incisional problems, thromboembolic phenomenon and other postoperative/anesthesia complications. The patient concurred with the proposed plan, giving informed written consent for the procedure.    FINDINGS:  Viable female infant in cephalic presentation.  Apgars 8 and 9, weight, 2 pounds and 4.7 ounces.  Clear amniotic fluid.  Intact placenta, three vessel cord.  Normal uterus, right fallopian tube.  4cm right ovarian cyst.    ANESTHESIA:    Spinal INTRAVENOUS FLUIDS:2600 ml ESTIMATED BLOOD LOSS: 650 ml URINE OUTPUT:  300 ml SPECIMENS: Placenta sent to pathology COMPLICATIONS: None immediate  PROCEDURE IN DETAIL:  The patient received intravenous antibiotics and had sequential compression devices applied to her lower extremities while in the preoperative area.  She was then taken to the operating room where spinal anesthesia was administered and was found to be adequate. She was then placed in a dorsal supine position with a leftward tilt, and prepped and draped in a sterile manner.  A foley catheter was placed into her bladder and attached to constant gravity, which  drained clear fluid throughout.  After an adequate timeout was performed, a Pfannenstiel skin incision was made with scalpel and carried through to the underlying layer of fascia. The fascia was incised in the midline and this incision was extended bilaterally using the Mayo scissors. Kocher clamps were applied to the superior aspect of the fascial incision and the underlying rectus muscles were dissected off bluntly. A similar process was carried out on the inferior aspect of the facial incision. The rectus muscles were separated in the midline bluntly and the peritoneum was entered bluntly. On inspection, the left side of the uterus was densely adhered to the abdominal wall, which was taken down in several areas to access the uterus.  An Alexis retractor was placed to aid in visualization of the uterus.  Attention was turned to the lower uterine segment where a vertical hysterotomy was made with a scalpel and extended bluntly. The infant was successfully delivered, and cord was clamped and cut and infant was handed over to awaiting neonatology team. Uterine massage was then administered and the placenta delivered intact with three-vessel cord. The uterus was then cleared of clot and debris.  The hysterotomy was closed with 0 Vicryl in a running locked fashion, and an imbricating layer was also placed with a 0 Vicryl. Overall, excellent hemostasis was noted. The abdomen and the pelvis were cleared of all clot and debris and the Jon Gills was removed. The areas where the adhesions were taken down were inspected and hemostasis was achieved with cautery and further suturing.  Hemostasis was confirmed on all surfaces.  Seprafilm was the placed as a slurry into the abdominal cavity.  The fascia was then closed using 0 Vicryl in a running fashion.  The skin was closed with 4-0 vicryl. The  patient tolerated the procedure well. Sponge, lap, instrument and needle counts were correct x 2. She was taken to the recovery room in  stable condition.   This patient was told that she should never labor again, due to the vertical hysterotomy.  For future cesarean sections, I would recommend the assistance of a second obstetrician.     Candelaria Celeste JEHIEL DO 03/09/2014 12:41 PM

## 2014-03-09 NOTE — Anesthesia Preprocedure Evaluation (Signed)
Anesthesia Evaluation  Patient identified by MRN, date of birth, ID band Patient awake    Reviewed: Allergy & Precautions, H&P , Patient's Chart, lab work & pertinent test results  Airway Mallampati: II TM Distance: >3 FB Neck ROM: full    Dental no notable dental hx.    Pulmonary  breath sounds clear to auscultation  Pulmonary exam normal       Cardiovascular Exercise Tolerance: Good Rhythm:regular Rate:Normal     Neuro/Psych    GI/Hepatic   Endo/Other    Renal/GU      Musculoskeletal   Abdominal   Peds  Hematology   Anesthesia Other Findings   Reproductive/Obstetrics                           Anesthesia Physical Anesthesia Plan  ASA: II and emergent  Anesthesia Plan: Spinal   Post-op Pain Management:    Induction:   Airway Management Planned:   Additional Equipment:   Intra-op Plan:   Post-operative Plan:   Informed Consent: I have reviewed the patients History and Physical, chart, labs and discussed the procedure including the risks, benefits and alternatives for the proposed anesthesia with the patient or authorized representative who has indicated his/her understanding and acceptance.   Dental Advisory Given  Plan Discussed with: CRNA  Anesthesia Plan Comments: (Lab work confirmed with CRNA in room. Platelets okay. Discussed spinal anesthetic, and patient consents to the procedure:  included risk of possible headache,backache, failed block, allergic reaction, and nerve injury. This patient was asked if she had any questions or concerns before the procedure started. )        Anesthesia Quick Evaluation  

## 2014-03-09 NOTE — Anesthesia Postprocedure Evaluation (Signed)
  Anesthesia Post-op Note  Patient: Brittany Perez  Procedure(s) Performed: Procedure(s): CESAREAN SECTION (N/A)  Patient is awake, responsive, moving her legs, and has signs of resolution of her numbness. Pain and nausea are reasonably well controlled. Vital signs are stable and clinically acceptable. Oxygen saturation is clinically acceptable. There are no apparent anesthetic complications at this time. Patient is ready for discharge.

## 2014-03-09 NOTE — Transfer of Care (Signed)
Immediate Anesthesia Transfer of Care Note  Patient: Brittany Perez  Procedure(s) Performed: Procedure(s): CESAREAN SECTION (N/A)  Patient Location: PACU  Anesthesia Type:Spinal  Level of Consciousness: awake, alert  and oriented  Airway & Oxygen Therapy: Patient Spontanous Breathing  Post-op Assessment: Report given to PACU RN and Post -op Vital signs reviewed and stable  Post vital signs: Reviewed and stable  Complications: No apparent anesthesia complications

## 2014-03-09 NOTE — Plan of Care (Signed)
Problem: Consults Goal: Birthing Suites Patient Information Press F2 to bring up selections list Outcome: Completed/Met Date Met:  03/09/14  Pt < [redacted] weeks EGA

## 2014-03-09 NOTE — MAU Provider Note (Signed)
History     CSN: [id number]  Arrival date and time: 03/09/14 4098   First Provider Initiated Contact with Patient 03/09/14 [phone]      Chief Complaint  Patient presents with  . Vaginal Bleeding  . Back Pain   HPI Ms. Brittany Perez is a 28 y.o. [id number] at [redacted]w[redacted]d who presents to MAU today with complaint of vaginal bleeding since last night. She states that she has only noted blood with wiping and not needed a pad. She states moderate pelvic cramping and low back pain as well. She denies LOF. She was admitted to Antenatal ~ 2 weeks ago for cervical incompetance. She was 2 cm dilated and stable at time of discharge. She received Mg and Betamethasone during that admission. She reports good fetal movement.   OB History   Grav Para Term Preterm Abortions TAB SAB Ect Mult Living   0 0 0 0 0 2      Past Medical History  Diagnosis Date  . Preterm labor     Past Surgical History  Procedure Laterality Date  . Cesarean section      Family History  Problem Relation Age of Onset  . Diabetes Mother     History  Substance Use Topics  . Smoking status: Never Smoker   . Smokeless tobacco: Never Used  . Alcohol Use: No    Allergies: No Known Allergies  Facility-administered medications prior to admission  Medication Dose Route Frequency Provider Last Rate Last Dose  . hydroxyprogesterone caproate (DELALUTIN) 250 mg/mL injection 250 mg  250 mg Intramuscular Weekly Ugonna A Anyanwu, MD   250 mg at 03/04/14 1016   Prescriptions prior to admission  Medication Sig Dispense Refill  . docusate sodium 100 MG CAPS Take 100 mg by mouth daily.  10 capsule  0  . Prenatal Vit-Fe Fumarate-FA (PRENATAL MULTIVITAMIN) TABS tablet Take 1 tablet by mouth daily at 12 noon.      . progesterone (PROMETRIUM) 200 MG capsule Place 1 capsule (200 mg total) vaginally at bedtime.  30 capsule  3    Review of Systems  Constitutional: Negative for fever and malaise/fatigue.  Gastrointestinal: Positive for  abdominal pain.  Genitourinary:       + vaginal bleeding Neg - vaginal discharge, LOF  Musculoskeletal: Positive for back pain.   Physical Exam   Blood pressure 133/79, pulse 113, temperature 98.8 F (37.1 C), temperature source Oral, resp. rate 20, height  (1.6 m), weight 179 lb (81.194 kg), last menstrual period 08/23/2013, SpO2 100.00%.  Physical Exam  Constitutional: She is oriented to person, place, and time. She appears well-developed and well-nourished. No distress.  HENT:  Head: Normocephalic.  Cardiovascular: Normal rate.   Respiratory: Effort normal.  GI: Soft. She exhibits no distension and no mass. There is no tenderness. There is no rebound and no guarding.  Neurological: She is alert and oriented to person, place, and time.  Skin: Skin is warm and dry. No erythema.  Psychiatric: She has a normal mood and affect.  Dilation: 5 (BBOW) Effacement (%): 100 Presentation: Complete Breech Exam by:: Dr Macon Large  Results for orders placed during the hospital encounter of 03/09/14 (from the past 24 hour(s))  URINALYSIS, ROUTINE W REFLEX MICROSCOPIC     Status: Abnormal   Collection Time    03/09/14  6:21 AM      Result Value Ref Range   Color, Urine YELLOW  YELLOW   APPearance CLOUDY (*) CLEAR   Specific  Gravity, Urine 1.015  1.005 - 1.030   pH 7.0  5.0 - 8.0   Glucose, UA NEGATIVE  NEGATIVE mg/dL   Hgb urine dipstick LARGE (*) NEGATIVE   Bilirubin Urine NEGATIVE  NEGATIVE   Ketones, ur NEGATIVE  NEGATIVE mg/dL   Protein, ur NEGATIVE  NEGATIVE mg/dL   Urobilinogen, UA 0.2  0.0 - 1.0 mg/dL   Nitrite NEGATIVE  NEGATIVE   Leukocytes, UA MODERATE (*) NEGATIVE  URINE MICROSCOPIC-ADD ON     Status: Abnormal   Collection Time    03/09/14  6:21 AM      Result Value Ref Range   Squamous Epithelial / LPF MANY (*) RARE   WBC, UA 21-50  <3 WBC/hpf   RBC / HPF 21-50  <3 RBC/hpf   Bacteria, UA FEW (*) RARE    MAU Course  Procedures None  MDM UA today  Attempted to  check patient's cervix, ? 4-5/100%/breech Called Dr. Macon Large to come and re-check cervix. She is in MAU to assess and admit the patient.   Assessment and Plan  A: SIUP at [redacted]w[redacted]d Preterm labor Cervical incompetance  P: Admit to L&D  Marny Lowenstein, PA-C  03/09/2014, 7:04 AM

## 2014-03-09 NOTE — Progress Notes (Signed)
Dr Macon Large explained risks and benefits of c-section for breech presentation to patient with interpreter present.  Pt verbalized understanding and signed consent.

## 2014-03-09 NOTE — Progress Notes (Signed)
UR completed 

## 2014-03-09 NOTE — MAU Provider Note (Addendum)
Attestation of Attending Supervision of Advanced Practitioner (PA/CNM/NP): Evaluation and management procedures were performed by the Advanced Practitioner under my supervision and collaboration.  I have reviewed the Advanced Practitioner's note and chart, and I agree with the management and plan.  On my exam, patient is 5/100/BBOW/fetal legs palpated.  Complete breech presentation confirmed on bedside scan.  Discussed need for inpatient admission, tocolysis.  She received BMZ 8/25 and 8/26. Will admit to L&D and start tocolysis with magnesium sulfate and indomethacin.  Patient had a previous cesarean section and has breech presentation, understands that she will need a RCS for unstoppable labor or other indication for delivery.  Will sign consent with patient once interpreter [facility]) is available. Neonatology team aware of this patient's admission.  Anesthesiology team/OR also aware.   Jaynie Collins, MD, FACOG Attending Obstetrician & Gynecologist Faculty Practice, [facility]

## 2014-03-09 NOTE — Lactation Note (Signed)
This note was copied from the chart of Brittany Camellia Fluckiger. Lactation Consultation Note     Initial consult with this mom of a nICU baby, now 6 hours old, and 28 2/7 weeks CGA. This is mom's second premis, and is familiar with pumping. i wan ont able to hand express any colostrum yet, but I explained to mom this is normal. Wic was faxed mom's inforamtion. Mom knows to call for lactation as needed.  Patient Name: Brittany Perez [id number] Date: 03/09/2014 Reason for consult: Initial assessment;NICU baby;Infant < 6lbs   Maternal Data Formula Feeding for Exclusion: Yes (baby in NICU) Has patient been taught Hand Expression?: Yes Does the patient have breastfeeding experience prior to this delivery?: Yes  Feeding    LATCH Score/Interventions                      Lactation Tools Discussed/Used Tools: Pump Breast pump type: Double-Electric Breast Pump WIC Program: Yes (info faxed to [facility] for DEP - mom active with WIC) Pump Review: Setup, frequency, and cleaning;Milk Storage;Other (comment) (premie setting, hand expression, review of NICU book on EBm) Initiated by:: clee rn at 6 hours pp Date initiated:: 03/09/14   Consult Status Consult Status: Follow-up Date: 03/10/14 Follow-up type: In-patient    Alfred Levins 03/09/2014, 6:00 PM

## 2014-03-09 NOTE — Plan of Care (Signed)
Problem: Consults Goal: Birthing Suites Patient Information Press F2 to bring up selections list Outcome: Completed/Met Date Met:  03/09/14  Pt < [redacted] weeks EGA     

## 2014-03-09 NOTE — Addendum Note (Signed)
Addendum created 03/09/14 1453 by Cristela Blue, MD   Modules edited: Anesthesia Events

## 2014-03-09 NOTE — Progress Notes (Signed)
Patient ID: Brittany Perez, female   DOB: February 06, 1986, 28 y.o.   MRN: [id number]  Patient recheck.  Having labor with cervical progression to about 7-8 cm.  Will proceed with c-section, due to breech presentation.  Explained to patient.  The risks of cesarean section discussed with the patient included but were not limited to: bleeding which may require transfusion or reoperation; infection which may require antibiotics; injury to bowel, bladder, ureters or other surrounding organs; injury to the fetus; need for additional procedures including hysterectomy in the event of a life-threatening hemorrhage; placental abnormalities wth subsequent pregnancies, incisional problems, thromboembolic phenomenon and other postoperative/anesthesia complications. The patient concurred with the proposed plan, giving informed written consent for the procedure.   Patient has been NPO since 8pm the prior night.   she will remain NPO for procedure. Anesthesia and OR aware.  Preoperative prophylactic Ancef ordered on call to the OR.  To OR when ready.

## 2014-03-09 NOTE — Consult Note (Signed)
Neonatology Note:  Attendance at C-section:  I was asked by Dr. Stinson to attend this Stat C/S at 28 2/7 weeks due to progression of preterm labor and breech presentation. 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 She received Betamethasone 8/25-26 and has been getting 17P injections since 16 weeks. She returned today in preterm labor and got a dose of Indomethacin and a bolus of Magnesium sulfate this morning. ROM at delivery, fluid clear. Infant delivered double footling breech. She was bulb suctioned, then she began to cry and was vigorous with normal HR and good tone. We dried her and placed her into the portawarmer bag for temp support, then placed a pulse oximeter. She had somewhat irregular respirations and decreased air exchange, so we placed the neopuff on her, with good results. Her O2 saturations rose and we titrated the FIO2 to keep saturations within acceptable range. Ap 8/9. Viewed briefly by her mother in the OR, then transported to the NICU for further care, on neopuff with 25-30% FIO2. Her father was in attendance.  Brittany Perez C. Timeka Goette, MD  

## 2014-03-09 NOTE — MAU Note (Signed)
Pt reports vaginal bleeding since midnight and some abdominal cramping and back pain. +FM

## 2014-03-09 NOTE — Anesthesia Procedure Notes (Signed)
Spinal Patient location during procedure: OR Preanesthetic Checklist Completed: patient identified, site marked, surgical consent, pre-op evaluation, timeout performed, IV checked, risks and benefits discussed and monitors and equipment checked Spinal Block Patient position: sitting Prep: DuraPrep Patient monitoring: heart rate, cardiac monitor, continuous pulse ox and blood pressure Approach: midline Location: L3-4 Injection technique: single-shot Needle Needle type: Sprotte  Needle gauge: 24 G Needle length: 9 cm Assessment Sensory level: T4 Additional Notes Spinal Dosage in OR  Bupivicaine ml       1.3 PFMS04   mcg        100    

## 2014-03-10 ENCOUNTER — Encounter (HOSPITAL_COMMUNITY): Payer: Self-pay | Admitting: *Deleted

## 2014-03-10 LAB — CBC
HEMATOCRIT: 27.6 % — AB (ref 36.0–46.0)
HEMOGLOBIN: 9.2 g/dL — AB (ref 12.0–15.0)
MCH: 26.1 pg (ref 26.0–34.0)
MCHC: 33.3 g/dL (ref 30.0–36.0)
MCV: 78.2 fL (ref 78.0–100.0)
Platelets: 239 10*3/uL (ref 150–400)
RBC: 3.53 MIL/uL — ABNORMAL LOW (ref 3.87–5.11)
RDW: 14.7 % (ref 11.5–15.5)
WBC: 15.2 10*3/uL — ABNORMAL HIGH (ref 4.0–10.5)

## 2014-03-10 NOTE — Progress Notes (Signed)
Subjective: Postpartum Day #1: Classical Cesarean Delivery at 28.2 wks for breech in labor Patient reports incisional pain, tolerating PO, + flatus and no problems voiding.  Ambulatory without orthostatic sx.  Baby stable in NICU  Objective: Vital signs in last 24 hours: Temp:  [97.5 F (36.4 C)-98.9 F (37.2 C)] 98.9 F (37.2 C) (09/16 0558) Pulse Rate:  [86-106] 106 (09/16 0558) Resp:  [14-20] 18 (09/16 0558) BP: (95-130)/(41-77) 98/46 mmHg (09/16 0558) SpO2:  [95 %-100 %] 95 % (09/16 0558) Filed Vitals:   03/10/14 0300 03/10/14 0410 03/10/14 0500 03/10/14 0558  BP:  99/58  98/46  Pulse:  97  106  Temp:  98.7 F (37.1 C)  98.9 F (37.2 C)  TempSrc:  Oral  Oral  Resp:  18  18  Height:      Weight:      SpO2: 97% 98% 96% 95%   Physical Exam:  General: alert, cooperative and no distress Lochia: appropriate Uterine Fundus: firm Incision: dressing C/D/I except scant dried blood, appropriately tender DVT Evaluation: No evidence of DVT seen on physical exam.   Recent Labs  03/09/14 0715 03/10/14 0531  HGB 12.1 9.2*  HCT 35.3* 27.6*    Assessment/Plan: Status post Cesarean section. Doing well postoperatively.  Continue current care. Iron supplementation on discharge for ABL anemia  Brittany Perez 03/10/2014, 10:48 AM

## 2014-03-10 NOTE — Lactation Note (Signed)
This note was copied from the chart of Brittany Saydi Slemmer. Lactation Consultation Note  Mom states she is pumping but not obtaining milk yet.  Reassured and encouraged to continue pumping every 3 hours.  Instructed to call for concerns prn.  Will continue to follow.  Patient Name: Brittany Perez [id number] Date: 03/10/2014     Maternal Data    Feeding    LATCH Score/Interventions                      Lactation Tools Discussed/Used     Consult Status      [name] 03/10/2014, 11:16 AM

## 2014-03-11 ENCOUNTER — Ambulatory Visit: Payer: Medicaid Other

## 2014-03-11 MED ORDER — FERROUS SULFATE 325 (65 FE) MG PO TABS
325.0000 mg | ORAL_TABLET | Freq: Every day | ORAL | Status: DC
  Administered 2014-03-12: 325 mg via ORAL
  Filled 2014-03-11: qty 1

## 2014-03-11 NOTE — Lactation Note (Signed)
This note was copied from the chart of [name]. Lactation Consultation Note     Follow up consult with this mom of a NICU baby, now 28 4/7 weeks CGA and 47 hours post partum. Mom will be discharged to home tomorrow, and will get a dEP from [facility]. On exam, with hand expression, I still was not able to express any colostrum. Mom is concerned. I advised her to keep pumping 8 times a day, followed by hand expression, and to rest and stay hydrated. I told her hopefully she will begin transitioning into milk with the next day .Mom has not held her baby yet, and said after visiting her she can not stop crying. I advised her to ask her baby's nurse when she could hold her, and if not yet, to at least touch her and talk to her.  She was so glad to hear that she would be able to hold her baby soon. She said it was a month before she was able to hold her 30 year old, who was a 25 week, 1 pound baby.  I told her I am not sure when she could hold her, but it will not be as long as the last time. Mom knows to call for lactation assistance as needed.  Patient Name: [name] Date: 03/11/2014 Reason for consult: Follow-up assessment;NICU baby   Maternal Data    Feeding Feeding Type: Donor Breast Milk  LATCH Score/Interventions                      Lactation Tools Discussed/Used WIC Program: Yes (mom has been contacted by [facility] - thye will get a DEP on mom's discahrge to home tomrrow)   Consult Status Consult Status: PRN Follow-up type: In-patient (NICU)    Alfred Levins 03/11/2014, 1:01 PM

## 2014-03-11 NOTE — Progress Notes (Signed)
Subjective: Postpartum Day #2: Cesarean Delivery Patient reports tolerating PO and no problems voiding. Pumping for NICU infant. Did not discuss contraception.   Objective: Vital signs in last 24 hours: Temp:  [98 F (36.7 C)-99.9 F (37.7 C)] 98.2 F (36.8 C) (09/17 1610) Pulse Rate:  [73-116] 73 (09/17 0633) Resp:  [17-18] 18 (09/17 0633) BP: (88-109)/(49-62) 88/49 mmHg (09/17 0633) SpO2:  [95 %-100 %] 100 % (09/17 9604)  Physical Exam:  General: alert, cooperative and mild distress Lochia: appropriate Uterine Fundus: firm Incision: honeycomb intact; marked and unchanged DVT Evaluation: No evidence of DVT seen on physical exam.   Recent Labs  03/09/14 0715 03/10/14 0531  HGB 12.1 9.2*  HCT 35.3* 27.6*    Assessment/Plan: Status post Cesarean section. Doing well postoperatively.  Continue current care. Anticipate d/c in AM 9/18. Ferrous sulfate qd ordered.  Cam Hai CNM 03/11/2014, 9:31 AM

## 2014-03-11 NOTE — Progress Notes (Signed)
Clinical Social Work Department PSYCHOSOCIAL ASSESSMENT - MATERNAL/CHILD 03/11/2014  Patient:  Dalton,Roanna  Account Number:  [id number]  Admit Date:  03/09/2014  Childs Name:   Elly Gravois    Clinical Social Worker:  Jaleigha Deane, LCSW   Date/Time:  03/11/2014 03:15 PM  Date Referred:        Other referral source:   No referral-NICU admission    I:  FAMILY / HOME ENVIRONMENT Child's legal guardian:  PARENT  Guardian - Name Guardian - Age Guardian - Address  Verlyn Todorov [address], [place], [place] [address]  Thum Helseth  same   Other household support members/support persons Name Relationship DOB  Seli Woodberry DAUGHTER 9  Mely Wolke DAUGHTER 6   Other support:   Parents state both sides of the family live in the area. They state all of their family works and will not be able to assist with transportation.  They also state they do not like to ask for help.    II  PSYCHOSOCIAL DATA Information Source:  Family Interview  Financial and Community Resources Employment:   FOB works night shifts at [employer]  MOB states that she was working "temporarily."  CSW is unclear as to the work she was doing.  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  CSW asked if the family will accept assistance from the hospital (ie: emotional support, education, supplies from [employer]) and he said yes.  He was timid with this response, but grateful. The couple's primary language is a dialect called Bohnar, but they are fluent in English, especially FOB.  He states his sister is an RN at [employer] and if there is ever a time where they need assistance with medical terms, she can help interpret because  there are no interpreters in the area who speak [place].    III  STRENGTHS Strengths  Adequate Resources  Compliance with medical plan  Other - See comment  Supportive family/friends  Understanding of illness   Strength comment:  FOB states his other daughters go to [facility] for pediatric follow up.  CSW explained that this office does not accept babies any longer and asked if they would like a pediatrician list.  CSW also informed parents that [facility] and [facility] will take babies with Medicaid.  FOB states he would like to take baby to [facility].   IV  RISK FACTORS AND CURRENT PROBLEMS Current Problem:       V  SOCIAL WORK ASSESSMENT  CSW met with parents in MOB's third floor room/318 to introduce myself, complete assessment due to NICU admission at 28 weeks and offer support.  Parents were extremely pleasant and welcoming of CSW's intervention.  They report that they are doing well and that baby is doing well.  They referred to baby many times as being "strong."  They informed CSW that they have 2 daughters at home and that their first daughter was born at 25 weeks, 9 years ago.  They often compared their new baby to this baby.  They report feeling much more at ease this time, having had the NICU experience in the past, being older, and having baby be 3 weeks   farther along.  CSW spoke with them in great depth about their feelings related to baby's premature birth and NICU admission.  They report their 6 year old daughter was born at term.  CSW reviewed signs and symptoms of PPD.  MOB states she is concerned about her newborn, but hopeful that she will do better than her first child, who ultimately had a good outcome, and is a healthy child now.  She states she feels she is appropriately saddened by her daughter's premature birth, but knows that her daughter is in good hands in the NICU at [facility].  MOB was teary at times.  FOB seemed very supportive.  He was more talkative than MOB, however, seems more  comfortable in his English than MOB.  She spoke to him in their primary language, Bohnar, at times, but states comfort in speaking English also.  She states no symptoms of PPD with either of her other children, but feels she had an appropriate level of emotion when dealing with her first child's NICU stay.  She reports her daughter was in the NICU for 3 months and 2 weeks.  The family states they have a good support system, however, FOB informed CSW that people in the Asian culture are embarrassed to ask for help.  He states their family all have jobs and that transportation for the next few weeks (while MOB recovers from her c-section) will be difficult.  After that, it will not be an issue, as both parents have a car.  They state they do not have baby supplies at this time, but plan to make preparations as best they can prior to baby's discharge.  CSW informed them of [facility] [employer] resources and asked them to let staff or CSW know if they have needs prior to discharge.  CSW asked if they would accept assistance from NICU staff.  FOB cautiously said they would and that they would be very grateful.  He anticipates needing assistance.  (CSW informed [facility] staff of the potential need).  CSW explained ongoing support services offered by NICU CSW and asked parents to contact CSW at any time.  CSW provided contact information.  Parents appeared very appreciative and thanked CSW for the visit.  CSW has no social concerns at this time.    VI SOCIAL WORK PLAN  Type of pt/family education:   If child protective services report - county:   If child protective services report - date:   Information/referral to community resources comment:   Other social work plan:     

## 2014-03-12 MED ORDER — IBUPROFEN 600 MG PO TABS
600.0000 mg | ORAL_TABLET | Freq: Four times a day (QID) | ORAL | Status: DC
Start: 2014-03-12 — End: 2022-07-25

## 2014-03-12 MED ORDER — OXYCODONE-ACETAMINOPHEN 5-325 MG PO TABS: 1.0000 | ORAL_TABLET | ORAL | Status: DC | PRN

## 2014-03-12 NOTE — Progress Notes (Signed)
Pt ambulated out  Teaching complete 

## 2014-03-12 NOTE — Discharge Instructions (Signed)

## 2014-03-12 NOTE — Lactation Note (Signed)
This note was copied from the chart of Brittany Perez. Lactation Consultation Note  Patient Name: Brittany Jaileen Janelle JYNWG'[id number] Date: 03/12/2014 Reason for consult: Follow-up assessment;NICU baby Spoke with Lolita Cram and Candace at the [facility] office. They have DEBP for Mom to pick up before 3:00 pm. Parents advised and report will go by [facility] office on way home from hospital. RN reviewing d/c instructions.   Maternal Data    Feeding    LATCH Score/Interventions                      Lactation Tools Discussed/Used Breast pump type: Double-Electric Breast Pump   Consult Status Consult Status: Follow-up Date: 03/12/14 Follow-up type: In-patient    Alfred Levins 03/12/2014, 1:32 PM

## 2014-03-12 NOTE — Lactation Note (Signed)
This note was copied from the chart of Brittany Perez. Lactation Consultation Note  Patient Name: Brittany Perez [id number] Date: 03/12/2014 Reason for consult: Follow-up assessment;NICU baby Mom reports that she is pumping consistently. Has started to receive few drops of colostrum, Mom reports her breasts are starting to fill. No engorgement present. Colostrum present with hand expression today.  Reviewed engorgement care with parents. Both report understanding. Left message with [facility] office to verify today that they will be able to get pump if d/c today, otherwise may need [facility] loaner. Parents to advise LC of pump status before d/c today.   Maternal Data    Feeding Feeding Type: Donor Breast Milk  LATCH Score/Interventions                      Lactation Tools Discussed/Used Breast pump type: Double-Electric Breast Pump   Consult Status Consult Status: Follow-up Date: 03/12/14 Follow-up type: In-patient    Alfred Levins 03/12/2014, 10:32 AM

## 2014-03-12 NOTE — Progress Notes (Signed)
I visited with pt while making rounds on the unit.  Brittany Perez appears to be coping well with having a premature baby in the NICU.  She reported that her 1st baby was born at 51 weeks here and was in the NICU 10 years ago.  She stated that this feels better to her because her baby is a few weeks older than that.    She reports good support from her husband and from her mother who lives with them.  Please page as needs arise.  [id number] [place] Pager, [phone] 10:32 AM   03/12/14 1000  Clinical Encounter Type  Visited With Patient  Visit Type Initial

## 2014-03-12 NOTE — Discharge Summary (Signed)
Obstetric Discharge Summary Reason for Admission: onset of labor Prenatal Procedures: none Intrapartum Procedures: cesarean: classical Postpartum Procedures: none Complications-Operative and Postpartum: none Hemoglobin  Date Value Ref Range Status  03/10/2014 9.2* 12.0 - 15.0 g/dL Final     DELTA CHECK NOTED     REPEATED TO VERIFY     HCT  Date Value Ref Range Status  03/10/2014 27.6* 36.0 - 46.0 % Final   Patient presented to [facility] secondary to vaginal bleeding and was found to be 5 cm with bulging membrane. She progressed to 7-8 cm. Ultrasound findings revealed a fetus in breech presentation. Patient was delivered by repeat classical cesarean section. She had received BMZ and MgSO4 during a previous admission.  Physical Exam:  General: alert, cooperative and no distress Lochia: appropriate Uterine Fundus: firm, NT Incision: dressing: clean, dry and intact DVT Evaluation: No evidence of DVT seen on physical exam. Negative Homan's sign.  Discharge Diagnoses: preterm labor with delivery  Discharge Information: Date: 03/12/2014 Activity: unrestricted and pelvic rest Diet: routine Medications: PNV, Ibuprofen and Percocet Condition: stable Instructions: refer to practice specific booklet Discharge to: home Follow-up Information   Follow up with [facility]. (An appointment will be made for you to be seen in 4 weeks)    Specialty:  Obstetrics and Gynecology   Contact information:   [address] [place] [address] [phone]      Newborn Data: Live born female  Birth Weight: 2 lb 4.7 oz (1040 g) APGAR: 8, 9  Home with will remain in NICU secondary to prematurity.  Annalea Alguire 03/12/2014, 12:55 PM

## 2014-03-18 ENCOUNTER — Ambulatory Visit: Payer: Medicaid Other

## 2014-03-25 ENCOUNTER — Encounter: Payer: Medicaid Other | Admitting: Family

## 2014-04-22 ENCOUNTER — Encounter: Payer: Self-pay | Admitting: Nurse Practitioner

## 2014-04-22 ENCOUNTER — Ambulatory Visit (INDEPENDENT_AMBULATORY_CARE_PROVIDER_SITE_OTHER): Payer: Medicaid Other | Admitting: Nurse Practitioner

## 2014-04-22 VITALS — BP 133/82 | HR 92 | Temp 98.3°F | Wt 170.5 lb

## 2014-04-22 DIAGNOSIS — F419 Anxiety disorder, unspecified: Secondary | ICD-10-CM

## 2014-04-22 DIAGNOSIS — Z309 Encounter for contraceptive management, unspecified: Secondary | ICD-10-CM

## 2014-04-22 LAB — POCT PREGNANCY, URINE: Preg Test, Ur: NEGATIVE

## 2014-04-22 MED ORDER — SERTRALINE HCL 50 MG PO TABS: 50.0000 mg | ORAL_TABLET | Freq: Every day | ORAL | Status: DC

## 2014-04-22 MED ORDER — ETONOGESTREL 68 MG ~~LOC~~ IMPL
68.0000 mg | DRUG_IMPLANT | Freq: Once | SUBCUTANEOUS | Status: AC
  Administered 2014-04-22: 68 mg via SUBCUTANEOUS

## 2014-04-22 NOTE — Progress Notes (Signed)
Subjective:     Brittany Perez is a 28 y.o. female who presents for a postpartum visit. She is 6 week postpartum following a low cervical transverse Cesarean section. I have fully reviewed the prenatal and intrapartum course. The delivery was at [redacted] weeks  gestational weeks. Outcome: repeat cesarean section, low transverse incision. Anesthesia: epidural. Postpartum course has been with some anxiety and dizziness and stress of preterm delivery and [facility]. She would like some medications. Baby's course has been : on vent and has infection, off vent now. Pecola Leisure. Baby is feeding by both breast and bottle - breast only. Bleeding no bleeding. Bowel function is normal. Bladder function is normal. Patient is not sexually active. Contraception method is Nexplanon. Postpartum depression screening: negative.  The following portions of the patient's history were reviewed and updated as appropriate: current medications, past family history, past medical history, past social history, past surgical history and problem list.  Review of Systems Pertinent items are noted in HPI.   Objective:    BP 133/82  Pulse 92  Temp(Src) 98.3 F (36.8 C) (Oral)  Wt 170 lb 8 oz (77.338 kg)  Breastfeeding? Yes  General:  alert   Breasts:  inspection negative, no nipple discharge or bleeding, no masses or nodularity palpable and pt declined exam stating they are fine  Lungs: clear to auscultation bilaterally  Heart:  regular rate and rhythm, S1, S2 normal, no murmur, click, rub or gallop  Abdomen: soft, non-tender; bowel sounds normal; no masses,  no organomegaly C/S is well healed without evidence of infection   Vulva:  not evaluated  Vagina: not evaluated  Cervix:  not examined  Corpus: not examined  Adnexa:  normal adnexa  Rectal Exam: Not performed.        Results for orders placed in visit on 04/22/14 (from the past 24 hour(s))  POCT PREGNANCY, URINE     Status: None   Collection Time    04/22/14  1:48 PM   Result Value Ref Range   Preg Test, Ur NEGATIVE  NEGATIVE   NEXPLANON INSERTION  Patient given informed consent, signed copy in the chart, time out was performed. Pregnancy test was negative. Appropriate time out taken.  Patient's left  arm was prepped and draped in the usual sterile fashion.. The ruler used to measure and mark insertion area.  Pt was prepped with alcohol swab and then injected with 3 cc of  1% lidocaine with epinephrine.  Pt was prepped with betadine, Nexplanon was removed form packaging,  Device confirmed in needle, then inserted full length of needle and withdrawn per handbook instructions.  Pt insertion site covered with sterile dressing.   Minimal blood loss.  Pt tolerated the procedure well. She is advised not to have intercourse for at least 1 week without condom.   Assessment:    postpartum exam. Pap smear not done at today's visit.   Anxiety/ situational  Contraception/ pt desires Nexplanon  Plan:    1. Contraception: Nexplanon 2. Zoloft 50 mg daily/ may take 1/2 tab at bedtime until anxiety is better 3. Follow up in: 4 week or as needed.

## 2014-04-22 NOTE — Patient Instructions (Addendum)

## 2014-04-26 ENCOUNTER — Encounter: Payer: Self-pay | Admitting: Nurse Practitioner

## 2014-05-11 ENCOUNTER — Ambulatory Visit: Payer: Self-pay

## 2014-05-11 NOTE — Lactation Note (Signed)
This [facility] Lactation Consultation Note  Visit with mom in the NICU.  Baby may be discharged in the AM.  Mom has a good milk supply and continues to pump on a regular basis.  Recommended outpatient appointment after discharge to assist with latch.  Patient Name: Docia Barrierlly Holdren YNWGN'FToday's Date: 05/11/2014     Maternal Data    Feeding    LATCH Score/Interventions                      Lactation Tools Discussed/Used     Consult Status      Huston FoleyMOULDEN, Clayborn Milnes S 05/11/2014, 1:34 PM

## 2014-05-28 ENCOUNTER — Ambulatory Visit (INDEPENDENT_AMBULATORY_CARE_PROVIDER_SITE_OTHER): Payer: Medicaid Other | Admitting: Obstetrics & Gynecology

## 2014-05-28 ENCOUNTER — Encounter: Payer: Self-pay | Admitting: Obstetrics & Gynecology

## 2014-05-28 VITALS — BP 120/72 | HR 79 | Temp 97.7°F | Ht 63.0 in | Wt 170.8 lb

## 2014-05-28 DIAGNOSIS — F53 Postpartum depression: Principal | ICD-10-CM

## 2014-05-28 DIAGNOSIS — O99345 Other mental disorders complicating the puerperium: Secondary | ICD-10-CM

## 2014-05-28 NOTE — Progress Notes (Signed)
Subjective:     Patient ID: Brittany Perez, female   DOB: [birth year], 28 y.o.   MRN: [id number]  [id number] F/u after rx for Zoloft at last visit for PP depression. Feels better and has stopped her medication.    Review of Systems  Psychiatric/Behavioral: Negative for sleep disturbance. The patient is not nervous/anxious.        Objective:   Physical Exam  Constitutional: She is oriented to person, place, and time. She appears well-developed and well-nourished. No distress.  Neurological: She is alert and oriented to person, place, and time.  Skin: Skin is warm and dry.  Psychiatric: She has a normal mood and affect. Her behavior is normal.       Assessment:     Doing well depression resolved     Plan:     Routine f/u  Adam PhenixJames G Arnold, MD 05/28/2014

## 2014-05-28 NOTE — Progress Notes (Signed)
Used Interpreter [employer] Siu. States taking zoloft somedays not everyday- maybe 2-3 times a week-states takes it only when she feels bad or down or has headache. [facility] . States she is feeling better since baby is home.

## 2014-06-11 ENCOUNTER — Encounter: Payer: Self-pay | Admitting: *Deleted

## 2019-01-15 DIAGNOSIS — L219 Seborrheic dermatitis, unspecified: Secondary | ICD-10-CM | POA: Diagnosis not present

## 2019-01-15 DIAGNOSIS — L408 Other psoriasis: Secondary | ICD-10-CM | POA: Diagnosis not present

## 2019-05-27 DIAGNOSIS — U071 COVID-19: Secondary | ICD-10-CM | POA: Diagnosis not present

## 2019-06-16 DIAGNOSIS — Z03818 Encounter for observation for suspected exposure to other biological agents ruled out: Secondary | ICD-10-CM | POA: Diagnosis not present

## 2019-11-18 DIAGNOSIS — E7849 Other hyperlipidemia: Secondary | ICD-10-CM | POA: Diagnosis not present

## 2019-11-18 DIAGNOSIS — Z1331 Encounter for screening for depression: Secondary | ICD-10-CM | POA: Diagnosis not present

## 2019-11-18 DIAGNOSIS — R6889 Other general symptoms and signs: Secondary | ICD-10-CM | POA: Diagnosis not present

## 2019-11-18 DIAGNOSIS — Z0000 Encounter for general adult medical examination without abnormal findings: Secondary | ICD-10-CM | POA: Diagnosis not present

## 2019-11-18 DIAGNOSIS — R739 Hyperglycemia, unspecified: Secondary | ICD-10-CM | POA: Diagnosis not present

## 2020-06-10 DIAGNOSIS — Z3046 Encounter for surveillance of implantable subdermal contraceptive: Secondary | ICD-10-CM | POA: Diagnosis not present

## 2020-06-10 DIAGNOSIS — Z01419 Encounter for gynecological examination (general) (routine) without abnormal findings: Secondary | ICD-10-CM | POA: Diagnosis not present

## 2020-06-10 DIAGNOSIS — Z3202 Encounter for pregnancy test, result negative: Secondary | ICD-10-CM | POA: Diagnosis not present

## 2020-06-10 DIAGNOSIS — Z3009 Encounter for other general counseling and advice on contraception: Secondary | ICD-10-CM | POA: Diagnosis not present

## 2020-07-01 DIAGNOSIS — Z3046 Encounter for surveillance of implantable subdermal contraceptive: Secondary | ICD-10-CM | POA: Diagnosis not present

## 2020-07-01 DIAGNOSIS — Z3009 Encounter for other general counseling and advice on contraception: Secondary | ICD-10-CM | POA: Diagnosis not present

## 2020-11-20 DIAGNOSIS — J069 Acute upper respiratory infection, unspecified: Secondary | ICD-10-CM | POA: Diagnosis not present

## 2020-11-20 DIAGNOSIS — J029 Acute pharyngitis, unspecified: Secondary | ICD-10-CM | POA: Diagnosis not present

## 2020-11-20 DIAGNOSIS — Z20822 Contact with and (suspected) exposure to covid-19: Secondary | ICD-10-CM | POA: Diagnosis not present

## 2020-11-20 DIAGNOSIS — R059 Cough, unspecified: Secondary | ICD-10-CM | POA: Diagnosis not present

## 2021-03-11 DIAGNOSIS — J029 Acute pharyngitis, unspecified: Secondary | ICD-10-CM | POA: Diagnosis not present

## 2021-03-11 DIAGNOSIS — R0982 Postnasal drip: Secondary | ICD-10-CM | POA: Diagnosis not present

## 2021-09-27 ENCOUNTER — Encounter (HOSPITAL_COMMUNITY): Payer: Self-pay

## 2021-09-27 ENCOUNTER — Inpatient Hospital Stay (HOSPITAL_COMMUNITY)
Admission: RE | Admit: 2021-09-27 | Discharge: 2021-09-27 | Disposition: A | Discharge status: Home / Self Care | Payer: Self-pay | Source: Ambulatory Visit

## 2021-09-27 ENCOUNTER — Ambulatory Visit (HOSPITAL_COMMUNITY)
Admission: EM | Admit: 2021-09-27 | Discharge: 2021-09-27 | Disposition: A | Discharge status: Home / Self Care | Payer: BC Managed Care – PPO | Attending: Family Medicine | Admitting: Family Medicine

## 2021-09-27 DIAGNOSIS — R42 Dizziness and giddiness: Secondary | ICD-10-CM | POA: Diagnosis not present

## 2021-09-27 DIAGNOSIS — M62838 Other muscle spasm: Secondary | ICD-10-CM | POA: Diagnosis not present

## 2021-09-27 DIAGNOSIS — G4452 New daily persistent headache (NDPH): Secondary | ICD-10-CM | POA: Diagnosis not present

## 2021-09-27 LAB — POCT URINALYSIS DIPSTICK, ED / UC
Bilirubin Urine: NEGATIVE
Glucose, UA: NEGATIVE mg/dL
Ketones, ur: NEGATIVE mg/dL
Leukocytes,Ua: NEGATIVE
Nitrite: NEGATIVE
Protein, ur: NEGATIVE mg/dL
Specific Gravity, Urine: 1.025 (ref 1.005–1.030)
Urobilinogen, UA: 0.2 mg/dL (ref 0.0–1.0)
pH: 5.5 (ref 5.0–8.0)

## 2021-09-27 LAB — CBG MONITORING, ED: Glucose-Capillary: 86 mg/dL (ref 70–99)

## 2021-09-27 LAB — POC URINE PREG, ED: Preg Test, Ur: NEGATIVE

## 2021-09-27 MED ORDER — MECLIZINE HCL 25 MG PO TABS: 25.0000 mg | ORAL_TABLET | Freq: Three times a day (TID) | ORAL | 0 refills | Status: AC | PRN

## 2021-09-27 NOTE — ED Provider Notes (Signed)
?[facility] ? ? ? ?CSN: [id number] ?Arrival date & time: 09/27/21  0947 ? ? ?  ? ?History   ?Chief Complaint ?Chief Complaint  ?Patient presents with  ? Headache  ? 10p appt  ? ? ?HPI ?Brittany Perez is a 36 y.o. female.  ? ?Dizziness ?Felt worse about 5 to 6 days ago ?Has been improving, but continues to feel tired and have some dizziness ?States that the dizziness worsens with movement of her Perez ?States that she tried to go to work yesterday, but was unable to as she still felt very tired ?States that when she checked her pulse yesterday it was in the 120s ?She denies any fevers ?States that she has been drinking normally ?Denies any chest pain at present, does state that she felt some pressure in her chest mostly on the right side intermittently yesterday ?Denies shortness of breath ?Denies hearing difficulties ?States that prior to symptoms starting 5-6 days ago, she was in her usual state of health ?Reports that overall she is improving, but not back to her normal state ? ?Declines interpretor ? ? ?Past Medical History:  ?Diagnosis Date  ? Preterm labor   ? ? ?Patient Active Problem List  ? Diagnosis Date Noted  ? 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 Contraception management 04/22/2014  ? Postpartum care and examination 04/22/2014  ? Status post cesarean delivery 03/09/2014  ? 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 Language barrier, speaks Montagnard only 12/03/2013  ? H/O preterm delivery, currently pregnant 11/12/2013  ? Previous cesarean delivery affecting pregnancy, antepartum 11/12/2013  ? ? ?Past Surgical History:  ?Procedure Laterality Date  ? CESAREAN SECTION    ? CESAREAN SECTION N/A 03/09/2014  ? Procedure: CESAREAN SECTION;  Surgeon: Levie Heritage, DO;  Location: [facility];  Service: Obstetrics;  Laterality: N/A;  ? ? ?OB History   ? ? Gravida  ?4  ? Para  ?4  ? Term  ?1  ? Preterm  ?3  ? AB  ?0  ? Living  ?3  ?  ? ? SAB  ?0  ? IAB  ?0  ? Ectopic  ?0  ? Multiple  ?0  ? Live  Births  ?4  ?   ?  ?  ? ? ? ?Home Medications   ? ?Prior to Admission medications   ?Medication Sig Start Date End Date Taking? Authorizing Provider  ?meclizine (ANTIVERT) 25 MG tablet Take 1 tablet (25 mg total) by mouth 3 (three) times daily as needed for dizziness. 09/27/21  Yes Sheba Whaling, Solmon Ice, DO  ?ibuprofen (ADVIL,MOTRIN) 600 MG tablet Take 1 tablet (600 mg total) by mouth every 6 (six) hours. ?Patient not taking: Reported on 05/28/2014 03/12/14   Constant, Peggy, MD  ?Prenatal Vit-Fe Fumarate-FA (PRENATAL MULTIVITAMIN) TABS tablet Take 1 tablet by mouth daily at 12 noon.    [provider]  ?sertraline (ZOLOFT) 50 MG tablet Take 1 tablet (50 mg total) by mouth daily. 04/22/14   BarefootDoralee Albino, NP  ? ? ?Family History ?Family History  ?Problem Relation Age of Onset  ? Diabetes Mother   ? ? ?Social History ?Social History  ? ?Tobacco Use  ? Smoking status: Never  ? Smokeless tobacco: Never  ?Substance Use Topics  ? Alcohol use: No  ? Drug use: No  ? ? ? ?Allergies   ?Patient has no known allergies. ? ? ?Review of Systems ?Review of Systems  ?All other systems reviewed and are negative. ? ? ?Physical  Exam ?Triage Vital Signs ?ED Triage Vitals  ?Enc Vitals Group  ?   BP 09/27/21 1001 (!) 140/95  ?   Pulse Rate 09/27/21 1001 96  ?   Resp 09/27/21 1001 18  ?   Temp 09/27/21 1001 97.8 ?F (36.6 ?C)  ?   Temp Source 09/27/21 1001 Oral  ?   SpO2 09/27/21 1001 98 %  ?   Weight --   ?   Height --   ?   Perez Circumference --   ?   Peak Flow --   ?   Pain Score 09/27/21 0959 3  ?   Pain Loc --   ?   Pain Edu? --   ?   Excl. in GC? --   ? ?No data found. ? ?Updated Vital Signs ?BP (!) 140/95 (BP Location: Left Arm)   Pulse 96   Temp 97.8 ?F (36.6 ?C) (Oral)   Resp 18   SpO2 98%   Breastfeeding No  ? ?Visual Acuity ?Right Eye Distance:   ?Left Eye Distance:   ?Bilateral Distance:   ? ?Right Eye Near:   ?Left Eye Near:    ?Bilateral Near:    ? ?Physical Exam ?Constitutional:   ?   General: She is not in  acute distress. ?   Appearance: Normal appearance. She is not ill-appearing.  ?HENT:  ?   Perez: Normocephalic and atraumatic.  ?   Right Ear: Tympanic membrane, ear canal and external ear normal.  ?   Left Ear: Tympanic membrane, ear canal and external ear normal.  ?   Nose: Nose normal.  ?   Mouth/Throat:  ?   Mouth: Mucous membranes are moist.  ?   Pharynx: No oropharyngeal exudate or posterior oropharyngeal erythema.  ?Eyes:  ?   Extraocular Movements: Extraocular movements intact.  ?   Conjunctiva/sclera: Conjunctivae normal.  ?   Pupils: Pupils are equal, round, and reactive to light.  ?Cardiovascular:  ?   Rate and Rhythm: Normal rate and regular rhythm.  ?   Heart sounds: No murmur heard. ?  No friction rub. No gallop.  ?Pulmonary:  ?   Effort: Pulmonary effort is normal. No respiratory distress.  ?   Breath sounds: Normal breath sounds. No wheezing, rhonchi or rales.  ?Musculoskeletal:     ?   General: No swelling.  ?   Cervical back: Normal range of motion and neck supple.  ?Lymphadenopathy:  ?   Cervical: No cervical adenopathy.  ?Skin: ?   General: Skin is warm and dry.  ?   Capillary Refill: Capillary refill takes less than 2 seconds.  ?Neurological:  ?   General: No focal deficit present.  ?   Mental Status: She is alert and oriented to person, place, and time.  ?   Cranial Nerves: No cranial nerve deficit.  ?   Sensory: No sensory deficit.  ?   Motor: No weakness.  ?   Coordination: Coordination normal.  ?   Gait: Gait normal.  ?   Comments: Slightly positive Dix Hallpike to left with dizziness, but no nystagmus  ?Psychiatric:     ?   Mood and Affect: Mood normal.     ?   Behavior: Behavior normal.  ? ? ? ?UC Treatments / Results  ?Labs ?(all labs ordered are listed, but only abnormal results are displayed) ?Labs Reviewed  ?POCT URINALYSIS DIPSTICK, ED / UC - Abnormal; Notable for the following components:  ?    Result  Value  ? Hgb urine dipstick MODERATE (*)   ? All other components within normal  limits  ?CBG MONITORING, ED  ?POC URINE PREG, ED  ? ? ?EKG ? ? ?Radiology ?No results found. ? ?Procedures ?Procedures (including critical care time) ? ?Medications Ordered in UC ?Medications - No data to display ? ?Initial Impression / Assessment and Plan / UC Course  ?I have reviewed the triage vital signs and the nursing notes. ? ?Pertinent labs & imaging results that were available during my care of the patient were reviewed by me and considered in my medical decision making (see chart for details). ? ?  ?POC urine with Hgb only, denies urinary symptoms.  Negative urine pregnancy and POC CBG.  EKG NSR.  Reassured that she is improving.  VSS today.  Neurologically intact on examination, no indication for Perez imaging at this time.  Prescription provided for meclizine to trial to see if this helps with her dizziness.  She reports that she was given the week off of work in order to rest.  Hopefully after this, she will be improved from what was likely a viral illness versus improving vertigo.  Recommend follow-up with a primary care provider, advised on how to obtain.  Given return precautions, see AVS. ? ? ?Final Clinical Impressions(s) / UC Diagnoses  ? ?Final diagnoses:  ?Dizziness  ? ? ? ?Discharge Instructions   ? ?  ?As we discussed, there is no concerning findings on your examination or work-up today.  It is possible that a virus is causing your symptoms.  It is also possible that you are improving from a vertigo attack from last week.  This can cause dizziness due to crystals within the inner ear being out of alignment, but this is unable to be seen on examination.  I have sent a prescription for a dizziness medication that you can try.  Get rest and make sure that you stay well-hydrated.  Go to [email or web address] to schedule an appointment with a primary care provider for a new patient visit as it is important that you follow-up with your primary care doctor.  If you have severe worsening of your symptoms,  especially if you develop chest pain, difficulty breathing, inability to stand, you should be seen by medical provider right away. ? ? ? ? ?ED Prescriptions   ? ? Medication Sig Dispense Auth. Provider  ? meclizine (ANT

## 2021-09-27 NOTE — Discharge Instructions (Signed)
As we discussed, there is no concerning findings on your examination or work-up today.  It is possible that a virus is causing your symptoms.  It is also possible that you are improving from a vertigo attack from last week.  This can cause dizziness due to crystals within the inner ear being out of alignment, but this is unable to be seen on examination.  I have sent a prescription for a dizziness medication that you can try.  Get rest and make sure that you stay well-hydrated.  Go to [email or web address] to schedule an appointment with a primary care provider for a new patient visit as it is important that you follow-up with your primary care doctor.  If you have severe worsening of your symptoms, especially if you develop chest pain, difficulty breathing, inability to stand, you should be seen by medical provider right away. ?

## 2021-09-27 NOTE — ED Triage Notes (Signed)
Over 1 week h/o HA, nausea, fatigue and dizziness. Has been taking tylenol w/o relief. No emesis.  ?

## 2022-06-27 DIAGNOSIS — R110 Nausea: Secondary | ICD-10-CM | POA: Diagnosis not present

## 2022-06-27 DIAGNOSIS — R42 Dizziness and giddiness: Secondary | ICD-10-CM | POA: Diagnosis not present

## 2022-06-27 DIAGNOSIS — M6289 Other specified disorders of muscle: Secondary | ICD-10-CM | POA: Diagnosis not present

## 2022-07-23 ENCOUNTER — Other Ambulatory Visit: Payer: Self-pay

## 2022-07-23 ENCOUNTER — Emergency Department (HOSPITAL_COMMUNITY): Payer: BC Managed Care – PPO

## 2022-07-23 ENCOUNTER — Encounter (HOSPITAL_COMMUNITY): Payer: Self-pay | Admitting: Emergency Medicine

## 2022-07-23 ENCOUNTER — Observation Stay (HOSPITAL_COMMUNITY)
Admission: EM | Admit: 2022-07-23 | Discharge: 2022-07-25 | Disposition: A | Discharge status: Home / Self Care | Payer: BC Managed Care – PPO | Attending: Student in an Organized Health Care Education/Training Program | Admitting: Student in an Organized Health Care Education/Training Program

## 2022-07-23 DIAGNOSIS — Z1152 Encounter for screening for COVID-19: Secondary | ICD-10-CM | POA: Diagnosis not present

## 2022-07-23 DIAGNOSIS — R059 Cough, unspecified: Secondary | ICD-10-CM | POA: Diagnosis not present

## 2022-07-23 DIAGNOSIS — R509 Fever, unspecified: Secondary | ICD-10-CM | POA: Diagnosis not present

## 2022-07-23 DIAGNOSIS — J101 Influenza due to other identified influenza virus with other respiratory manifestations: Secondary | ICD-10-CM

## 2022-07-23 DIAGNOSIS — E860 Dehydration: Secondary | ICD-10-CM

## 2022-07-23 DIAGNOSIS — R000 Tachycardia, unspecified: Secondary | ICD-10-CM

## 2022-07-23 DIAGNOSIS — Z79899 Other long term (current) drug therapy: Secondary | ICD-10-CM | POA: Insufficient documentation

## 2022-07-23 DIAGNOSIS — J09X2 Influenza due to identified novel influenza A virus with other respiratory manifestations: Secondary | ICD-10-CM | POA: Diagnosis not present

## 2022-07-23 DIAGNOSIS — R202 Paresthesia of skin: Secondary | ICD-10-CM | POA: Diagnosis present

## 2022-07-23 LAB — URINALYSIS, ROUTINE W REFLEX MICROSCOPIC
Bilirubin Urine: NEGATIVE
Glucose, UA: NEGATIVE mg/dL
Ketones, ur: NEGATIVE mg/dL
Leukocytes,Ua: NEGATIVE
Nitrite: NEGATIVE
Protein, ur: 30 mg/dL — AB
Specific Gravity, Urine: 1.023 (ref 1.005–1.030)
pH: 5 (ref 5.0–8.0)

## 2022-07-23 LAB — TROPONIN I (HIGH SENSITIVITY)
Troponin I (High Sensitivity): 21 ng/L — ABNORMAL HIGH (ref <18)
Troponin I (High Sensitivity): 47 ng/L — ABNORMAL HIGH (ref <18)
Troponin I (High Sensitivity): 66 ng/L — ABNORMAL HIGH (ref <18)

## 2022-07-23 LAB — BASIC METABOLIC PANEL
Anion gap: 13 (ref 5–15)
BUN: 11 mg/dL (ref 6–20)
CO2: 22 mmol/L (ref 22–32)
Calcium: 9.4 mg/dL (ref 8.9–10.3)
Chloride: 100 mmol/L (ref 98–111)
Creatinine, Ser: 1 mg/dL (ref 0.44–1.00)
GFR, Estimated: >60 mL/min (ref >60)
Glucose, Bld: 110 mg/dL — ABNORMAL HIGH (ref 70–99)
Potassium: 3.8 mmol/L (ref 3.5–5.1)
Sodium: 135 mmol/L (ref 135–145)

## 2022-07-23 LAB — RESP PANEL BY RT-PCR (RSV, FLU A&B, COVID)  RVPGX2
Influenza A by PCR: POSITIVE — AB
Influenza B by PCR: NEGATIVE
Resp Syncytial Virus by PCR: NEGATIVE
SARS Coronavirus 2 by RT PCR: NEGATIVE

## 2022-07-23 LAB — CBC
HCT: 40.7 % (ref 36.0–46.0)
Hemoglobin: 12.8 g/dL (ref 12.0–15.0)
MCH: 25.3 pg — ABNORMAL LOW (ref 26.0–34.0)
MCHC: 31.4 g/dL (ref 30.0–36.0)
MCV: 80.4 fL (ref 80.0–100.0)
Platelets: 272 10*3/uL (ref 150–400)
RBC: 5.06 MIL/uL (ref 3.87–5.11)
RDW: 14.3 % (ref 11.5–15.5)
WBC: 9.3 10*3/uL (ref 4.0–10.5)
nRBC: 0 % (ref 0.0–0.2)

## 2022-07-23 LAB — PREGNANCY, URINE: Preg Test, Ur: NEGATIVE

## 2022-07-23 LAB — CK: Total CK: 199 U/L (ref 38–234)

## 2022-07-23 LAB — LACTIC ACID, PLASMA
Lactic Acid, Venous: 2 mmol/L (ref 0.5–1.9)
Lactic Acid, Venous: 1.3 mmol/L (ref 0.5–1.9)

## 2022-07-23 MED ORDER — KETOROLAC TROMETHAMINE 15 MG/ML IJ SOLN
15.0000 mg | Freq: Once | INTRAMUSCULAR | Status: AC
  Administered 2022-07-23: 15 mg via INTRAVENOUS
  Filled 2022-07-23: qty 1

## 2022-07-23 MED ORDER — ACETAMINOPHEN 500 MG PO TABS
1000.0000 mg | ORAL_TABLET | Freq: Once | ORAL | Status: AC
Start: 2022-07-23 — End: 2022-07-23
  Administered 2022-07-23: 1000 mg via ORAL
  Filled 2022-07-23: qty 2

## 2022-07-23 MED ORDER — SODIUM CHLORIDE 0.9 % IV BOLUS (SEPSIS)
2000.0000 mL | Freq: Once | INTRAVENOUS | Status: AC
  Administered 2022-07-23: 2000 mL via INTRAVENOUS

## 2022-07-23 MED ORDER — OSELTAMIVIR PHOSPHATE 75 MG PO CAPS
75.0000 mg | ORAL_CAPSULE | Freq: Two times a day (BID) | ORAL | Status: DC
  Administered 2022-07-23 – 2022-07-25 (×4): 75 mg via ORAL
  Filled 2022-07-23 (×5): qty 1

## 2022-07-23 MED ORDER — ACETAMINOPHEN 325 MG PO TABS
650.0000 mg | ORAL_TABLET | Freq: Once | ORAL | Status: AC | PRN
Start: 2022-07-23 — End: 2022-07-23
  Administered 2022-07-23: 650 mg via ORAL
  Filled 2022-07-23: qty 2

## 2022-07-23 MED ORDER — ACETAMINOPHEN 325 MG PO TABS
650.0000 mg | ORAL_TABLET | Freq: Four times a day (QID) | ORAL | Status: DC | PRN
  Administered 2022-07-24: 650 mg via ORAL
  Filled 2022-07-23: qty 2

## 2022-07-23 MED ORDER — SODIUM CHLORIDE 0.9 % IV SOLN: Freq: Once | INTRAVENOUS | Status: DC

## 2022-07-23 MED ORDER — DM-GUAIFENESIN ER 30-600 MG PO TB12
1.0000 | ORAL_TABLET | Freq: Two times a day (BID) | ORAL | Status: DC | PRN
  Administered 2022-07-23 – 2022-07-24 (×2): 1 via ORAL
  Filled 2022-07-23 (×2): qty 1

## 2022-07-23 MED ORDER — ACETAMINOPHEN 650 MG RE SUPP: 650.0000 mg | Freq: Four times a day (QID) | RECTAL | Status: DC | PRN

## 2022-07-23 MED ORDER — LACTATED RINGERS IV SOLN: INTRAVENOUS | Status: AC

## 2022-07-23 MED ORDER — SENNOSIDES-DOCUSATE SODIUM 8.6-50 MG PO TABS: 1.0000 | ORAL_TABLET | Freq: Every evening | ORAL | Status: DC | PRN

## 2022-07-23 MED ORDER — RIVAROXABAN 10 MG PO TABS
10.0000 mg | ORAL_TABLET | Freq: Every day | ORAL | Status: DC
  Administered 2022-07-23 – 2022-07-25 (×3): 10 mg via ORAL
  Filled 2022-07-23 (×3): qty 1

## 2022-07-23 NOTE — ED Provider Notes (Addendum)
[facility] Provider Note   CSN: [id number] Arrival date & time: 07/23/22  0753     History  Chief Complaint  Patient presents with   Cough    Brittany Perez is a 37 y.o. female.  With no significant past medical history who presents with 2 days of generalized fatigue, dry cough and bodyaches with fever.  She has been taking over-the-counter medication without relief.  Her cough has been nonproductive.  She has had no vomiting, no diarrhea, no chest pain, no shortness of breath or abdominal pain.  She is tolerating p.o. but has not had much to eat or drink today.  She denies any pain with urination or change in color of urine.  Montagnard interpretor used.  Cough      Home Medications Prior to Admission medications   Medication Sig Start Date End Date Taking? Authorizing Provider  ibuprofen (ADVIL,MOTRIN) 600 MG tablet Take 1 tablet (600 mg total) by mouth every 6 (six) hours. Patient not taking: Reported on 05/28/2014 03/12/14   Constant, Peggy, MD  meclizine (ANTIVERT) 25 MG tablet Take 1 tablet (25 mg total) by mouth 3 (three) times daily as needed for dizziness. 09/27/21   Meccariello, Bernita Raisin, MD  Prenatal Vit-Fe Fumarate-FA (PRENATAL MULTIVITAMIN) TABS tablet Take 1 tablet by mouth daily at 12 noon.    [provider]  sertraline (ZOLOFT) 50 MG tablet Take 1 tablet (50 mg total) by mouth daily. 04/22/14   BarefootConnye Burkitt, NP      Allergies    Patient has no known allergies.    Review of Systems   Review of Systems  Respiratory:  Positive for cough.     Physical Exam Updated Vital Signs BP (!) 96/58   Pulse (!) 105   Temp 99.8 F (37.7 C) (Oral)   Resp 14   SpO2 100%  Physical Exam Constitutional: Alert and oriented.  Fatigued in appearance but no acute distress Eyes: Conjunctivae are normal. ENT      Mouth/Throat: Mucous membranes are moist.  No oropharyngeal erythema, uvula midline, no exudate      Neck: No  stridor. Cardiovascular: S1, S2, tachycardic, regular rhythm.Warm and well perfused. Respiratory: Normal respiratory effort. Breath sounds are normal.  O2 sat 100 on RA Gastrointestinal: Soft and nontender. There is no CVA tenderness. Musculoskeletal: Normal range of motion in all extremities. No pitting edema of lower extremities Neurologic: Normal speech and language. No gross focal neurologic deficits are appreciated. Skin: Skin is warm, dry and intact. No rash noted. Psychiatric: Mood and affect are normal. Speech and behavior are normal.  ED Results / Procedures / Treatments   Labs (all labs ordered are listed, but only abnormal results are displayed) Labs Reviewed  RESP PANEL BY RT-PCR (RSV, FLU A&B, COVID)  RVPGX2 - Abnormal; Notable for the following components:      Result Value   Influenza A by PCR POSITIVE (*)    All other components within normal limits  BASIC METABOLIC PANEL - Abnormal; Notable for the following components:   Glucose, Bld 110 (*)    All other components within normal limits  CBC - Abnormal; Notable for the following components:   MCH 25.3 (*)    All other components within normal limits  URINALYSIS, ROUTINE W REFLEX MICROSCOPIC - Abnormal; Notable for the following components:   APPearance HAZY (*)    Hgb urine dipstick MODERATE (*)    Protein, ur 30 (*)  Bacteria, UA RARE (*)    All other components within normal limits  PREGNANCY, URINE    EKG EKG Interpretation  Date/Time:  Monday July 23 2022 08:12:48 EST Ventricular Rate:  141 PR Interval:  116 QRS Duration: 72 QT Interval:  290 QTC Calculation: 444 R Axis:   66 Text Interpretation: Sinus tachycardia Otherwise normal ECG When compared with ECG of 27-Sep-2021 11:09, PREVIOUS ECG IS PRESENT Confirmed by Georgina Snell [phone]) on 07/23/2022 3:25:44 PM  Radiology DG Chest 2 View  Result Date: 07/23/2022 CLINICAL DATA:  cough, body aches, and fever that started yesterday. EXAM:  CHEST - 2 VIEW COMPARISON:  01/19/2009 FINDINGS: Lungs are clear. Heart size and mediastinal contours are within normal limits. No effusion. Visualized bones unremarkable. IMPRESSION: No acute cardiopulmonary disease. Electronically Signed   By: Lucrezia Europe M.D.   On: 07/23/2022 08:54    Procedures .Critical Care  Performed by: Elgie Congo, MD Authorized by: Elgie Congo, MD   Critical care provider statement:    Critical care time (minutes):  30   Critical care was necessary to treat or prevent imminent or life-threatening deterioration of the following conditions:  Dehydration   Critical care was time spent personally by me on the following activities:  Development of treatment plan with patient or surrogate, discussions with consultants, evaluation of patient's response to treatment, examination of patient, ordering and review of laboratory studies, ordering and review of radiographic studies, ordering and performing treatments and interventions, pulse oximetry, re-evaluation of patient's condition, review of old charts and obtaining history from patient or surrogate     Medications Ordered in ED Medications  sodium chloride 0.9 % bolus 2,000 mL (2,000 mLs Intravenous New Bag/Given 07/23/22 1511)  acetaminophen (TYLENOL) tablet 1,000 mg (has no administration in time range)  acetaminophen (TYLENOL) tablet 650 mg (650 mg Oral Given 07/23/22 0828)  ketorolac (TORADOL) 15 MG/ML injection 15 mg (15 mg Intravenous Given 07/23/22 1511)    ED Course/ Medical Decision Making/ A&P   {                            Medical Decision Making  Brittany Perez is a 37 y.o. female.  With no significant past medical history who presents with 2 days of generalized fatigue, dry cough and bodyaches with fever.  Of note, when patient presented she was febrile and tachycardic which improved with antipyretics and fluids.  Suspect combination of fever and dehydration contributing.  Patient's symptoms  seem most consistent with viral syndrome, perhaps secondary to COVID, influenza, or any other non-specific virus specially given high prevalence of disease in surrounding community. Will swab for COVID/influenza.  She did test positive for influenza A which seems most consistent with symptoms.  She is not hypoxic and has no increased work of breathing.  Chest x-ray obtained and personally reviewed by me no consolidation concerning for pneumonia.  She has a normal white blood cell count 9.3.  With positive flu test, doubt bacteremia or sepsis.  Her UA did not show evidence of UTI however did have hemoglobin and protein suggestive of dehydration.  Patient receiving 2 L IV fluids and antipyretics.  I have signed out to Dr. Francia Greaves pending reassessment.  If still persistently tachycardic plan to obtain troponin and plan for admission to further rule out myocarditis.  Amount and/or Complexity of Data Reviewed Labs: ordered. Radiology: ordered.  Risk OTC drugs. Prescription drug management.    Final Clinical  Impression(s) / ED Diagnoses Final diagnoses:  Influenza A  Dehydration    Rx / DC Orders ED Discharge Orders     None         Mardene Sayer, MD 07/23/22 1546    Mardene Sayer, MD 07/23/22 1547

## 2022-07-23 NOTE — ED Triage Notes (Signed)
Pt reports cough, body aches, and fever that started yesterday.

## 2022-07-23 NOTE — H&P (Cosign Needed Addendum)
Date: 07/23/2022               Patient Name:  Brittany Perez MRN: [id number]  DOB: [birth year] Age / Sex: 37 y.o., female   PCP: Patient, No Pcp Per              Medical Service: Internal Medicine Teaching Service              Attending Physician: Dr. Evette Doffing, Mallie Mussel, *    First Contact: Spero Curb, MS 3 Pager: [phone]  Second Contact: Dr. Angelique Blonder Pager: [phone]  Third Contact Dr. Buddy Duty Pager: [phone]       After Hours (After 5p/  First Contact Pager: [phone]  weekends / holidays): Second Contact Pager: [phone]   Chief Complaint: Fever  History of Present Illness:   Brittany Perez is a 37 y.o. female with no significant past medical history who presents for 1 day of fever, chills, productive cough, headache, and body aches. She also endorses "feeling tired" when taking deep breaths. She states she has been taking cough medicine and ibuprofen at home, which did not relieve symptoms. She also endorses burning sensation in her chest and numbness in her hands and feet, and says she has had prior episodes of hand numbness when she feels sick. She says she has been eating and drinking without any issues, having normal bowel movements, and denies nausea, vomiting, diarrhea or dysuria. Patient states her daughter is also sick.   Patient denies any recent travel, extensive car rides or plane rides, or recent surgeries. She also denies any personal or family history of blood clots.   Patient has been following with [facility], seen recently for neck/shoulder pain.   ED Course: Patient was initially febrile to 103F with tachycardia to 151 and BP 85/46. O2 sats normal on room air. Patient received 2L NS bolus, remained tachycardic to 110s. Influenza A positive.   Medical History: Patient denies any chronic conditions, denies history of hypertension, diabetes, or heart disease.   Surgical History: C-sections in 2006 and 2015.   Meds: Meclizine 25 mg prn No outpatient  medications have been marked as taking for the 07/23/22 encounter [facility] Encounter).    Allergies: Allergies as of 07/23/2022   (No Known Allergies)   Past Medical History:  Diagnosis Date   Preterm labor     Family History: Significant for diabetes. Denies family history of cardiac disease, stroke, or cancer.   Social History: Patient lives with her husband and daughter in [place]. She is currently working as a Regulatory affairs officer. She denies any history of tobacco use, alcohol use, or recreational drug use.   Review of Systems: A complete ROS was negative except as per HPI.   Physical Exam: Blood pressure (!) 96/58, pulse (!) 105, temperature 99.8 F (37.7 C), temperature source Oral, resp. rate 14, SpO2 100 %.  Physical Exam General: Appears uncomfortable, mildly diaphoretic.  CV: Tachycardic rate, normal rhythm. No murmurs, rubs, or gallops.  Pulm: Normal respiratory effort on room air. Lungs clear to auscultation bilaterally.  Abd: Normoactive bowel sounds. Soft, non-tender to palpation, no rigidity or guarding.  Skin: Warm and dry Ext: Negative Tinel's sign bilaterally. No LE edema. Neuro: A&Ox4. No focal deficit  Psych: Pleasant, appropriate affect  EKG: personally reviewed my interpretation is sinus tachycardia (rate 140, sinus rhythm, normal intervals, normal morphology).   CXR: personally reviewed my interpretation is no infiltrates or focal consolidation seen.  Assessment & Plan by Problem: Principal Problem:  Influenza A  Brittany Perez is a 37 y.o. with no significant past medical history who presents for 1 day of fever and myalgias, admitted for persistent tachycardia in the setting of positive Influenza A test.   Influenza A  Sinus Tachycardia 2/2 influenza Patient presented to the ED for 1 day of fever, cough, and myalgias that was unresponsive to home antipyretics, was febrile and tachycardic in the ED with mild improvement with fluid resuscitation. Low concern  for pneumonia at this time, patient has no dyspnea and no evidence of consolidation or infiltrates on CXR. Low suspicion for bacteremia with normal WBC count. Lactic acid level mildly elevated to 2.0. Patient does not have any risk factors for DVT/PE. Plan to observe overnight and continue supportive care. Suspect tachycardia will improve with continued hydration and also will encourage po intake. -Tamiflu 75mg  BID (1/5) -Acetaminophen 650mg  q6h PRN -Mucinex 2 times daily PRN -IV LR @100mL /hr -Follow troponin level  -Cancel blood culture order  Paraesthesias in Hands and Feet Patient endorses paraesthesias in hands and feet with history of prior episodes occurring during acute illnesses. Suspect this is most likely related to current episode of influenza. Also on the differential is cervical radiculopathy related to patient's job as Regulatory affairs officer and history of neck/shoulder pain, although would not expect this to involve her feet. No evidence of carpal tunnel syndrome on exam. Patient does not have history of diabetes and there is no evidence of B12 deficiency on CBC.  -Follow up outpatient with PCP  Full Code DVT Prophylaxis: Xarelto Diet: Regular Family Updates: Husband at bedside  Dispo: Admit patient to Observation with expected length of stay less than 2 midnights.  Signed: Spero Curb, Medical Student 07/23/2022, 5:23 PM  Pager: [phone]  Attestation for Student Documentation:  I personally was present and performed or re-performed the history, physical exam and medical decision-making activities of this service and have verified that the service and findings are accurately documented in the student's note.  Dorethea Clan, DO 07/23/2022, 6:41 PM

## 2022-07-23 NOTE — Discharge Instructions (Addendum)
You have been seen in the Emergency Department (ED)  today for cough and fever.  Your workup today is most consistent with an upper respiratory infection as you tested positive for the flu.  Please drink plenty of fluids.  You may use Tylenol and  Motrin, as written on the box, as needed for fever or discomfort.  Return to the Emergency Department (ED)  if you have worsening trouble breathing, chest pain, difficulty swallowing, neck pain or stiffness or other symptoms that concern you.  Please make an appointment to follow up with your primary care doctor within one week to assure improvement or resolution in symptoms.

## 2022-07-23 NOTE — ED Notes (Signed)
Patient arrived to room 40 via staff, AOX4, in stable condition, will continue to monitor.

## 2022-07-23 NOTE — ED Provider Notes (Signed)
Patient seen after prior EDP.  Patient with persistent tachycardia in the setting of acute flu infection.  Patient would benefit from admission for observation and continued IV fluid resuscitation.  Patient is in agreement with plan for admission.  Teaching team is aware case will evaluate for admission.   Valarie Merino, MD 07/23/22 [phone]

## 2022-07-23 NOTE — Hospital Course (Addendum)
Yesterday she started having a cough mucus, fever, and chills.  Had chest pain last night, burning sensation. She took cough medicine and ibuprofen at that time, but it did not help. Has headache + body aches  Numbness in hands (happens when she is sick?) Breathing feels tight , tired  Has been eating and drinking fine. No n/v/d, normal BM. Daughter has been coughing too  Has numbness in legs? Happens when she is sick  No recent travel, surgeries, hx of clots.  Meds: meclizine 25 mg prn  Fam hx: no hx of cardiac dz, stroke, or cancer. + Diabetes  Social Hx: No PCP Lives with husband and kids in [place].  Work: sewing  No tobacco, alcohol, drugs.

## 2022-07-24 DIAGNOSIS — Z79899 Other long term (current) drug therapy: Secondary | ICD-10-CM | POA: Diagnosis not present

## 2022-07-24 DIAGNOSIS — E860 Dehydration: Secondary | ICD-10-CM | POA: Diagnosis present

## 2022-07-24 DIAGNOSIS — R202 Paresthesia of skin: Secondary | ICD-10-CM | POA: Diagnosis present

## 2022-07-24 DIAGNOSIS — R000 Tachycardia, unspecified: Secondary | ICD-10-CM | POA: Diagnosis not present

## 2022-07-24 DIAGNOSIS — Z1152 Encounter for screening for COVID-19: Secondary | ICD-10-CM | POA: Diagnosis not present

## 2022-07-24 DIAGNOSIS — J101 Influenza due to other identified influenza virus with other respiratory manifestations: Secondary | ICD-10-CM | POA: Diagnosis not present

## 2022-07-24 LAB — CBC
HCT: 32.8 % — ABNORMAL LOW (ref 36.0–46.0)
Hemoglobin: 11 g/dL — ABNORMAL LOW (ref 12.0–15.0)
MCH: 26 pg (ref 26.0–34.0)
MCHC: 33.5 g/dL (ref 30.0–36.0)
MCV: 77.5 fL — ABNORMAL LOW (ref 80.0–100.0)
Platelets: 222 10*3/uL (ref 150–400)
RBC: 4.23 MIL/uL (ref 3.87–5.11)
RDW: 14.4 % (ref 11.5–15.5)
WBC: 12 10*3/uL — ABNORMAL HIGH (ref 4.0–10.5)
nRBC: 0 % (ref 0.0–0.2)

## 2022-07-24 LAB — BASIC METABOLIC PANEL
Anion gap: 8 (ref 5–15)
BUN: 14 mg/dL (ref 6–20)
CO2: 20 mmol/L — ABNORMAL LOW (ref 22–32)
Calcium: 8.1 mg/dL — ABNORMAL LOW (ref 8.9–10.3)
Chloride: 107 mmol/L (ref 98–111)
Creatinine, Ser: 0.86 mg/dL (ref 0.44–1.00)
GFR, Estimated: >60 mL/min (ref >60)
Glucose, Bld: 93 mg/dL (ref 70–99)
Potassium: 4.2 mmol/L (ref 3.5–5.1)
Sodium: 135 mmol/L (ref 135–145)

## 2022-07-24 LAB — TROPONIN I (HIGH SENSITIVITY)
Troponin I (High Sensitivity): 79 ng/L — ABNORMAL HIGH (ref <18)
Troponin I (High Sensitivity): 95 ng/L — ABNORMAL HIGH (ref <18)

## 2022-07-24 LAB — LACTIC ACID, PLASMA: Lactic Acid, Venous: 0.8 mmol/L (ref 0.5–1.9)

## 2022-07-24 LAB — HIV ANTIBODY (ROUTINE TESTING W REFLEX): HIV Screen 4th Generation wRfx: NONREACTIVE

## 2022-07-24 MED ORDER — LACTATED RINGERS IV SOLN: INTRAVENOUS | Status: AC

## 2022-07-24 NOTE — ED Notes (Signed)
Patient's heart rate 115 and oxygen saturation 99% while ambulating.

## 2022-07-24 NOTE — Progress Notes (Signed)
Troponin trending from 21->47->66->79.  Lactate from 2 to 1.3 s/p 2L NS  Patient remains febrile to 103.6F with stable blood pressure of 121/76 and improved tachycardia now to 90s. Patient assessed at bedside and she reports mild pleuritic pain with cough and deep inspiration. Denies nausea. Repeat EKG showed sinus tachycardia with no ischemic changes. A: Leading differential is demand ischemic in setting of acute illness. P: Trend troponins

## 2022-07-24 NOTE — Progress Notes (Signed)
   Subjective:  Patient was febrile to 103.60F and tachycardic to 117 overnight, repeat EKG showed sinus tachycardia.   Objective:  Vital signs in last 24 hours: Vitals:   07/24/22 1300 07/24/22 1330 07/24/22 1341 07/24/22 1345  BP: 101/64 (!) 114/51  95/68  Pulse: 94 91  91  Resp: 16 (!) 22  20  Temp:   97.7 F (36.5 C)   TempSrc:   Oral   SpO2: 100% 93%  92%   Weight change:  No intake or output data in the 24 hours ending 07/24/22 1451  Physical Exam General: Ill-appearing, diaphoretic.  HEENT: dry mucous membranes CV: Tachycardic, normal S1 and S2. No murmurs, rubs, or gallops.  Pulm: Mildly tachypneic, O2 saturation normal on room air. Lungs clear to auscultation bilaterally  Skin: Warm and dry Ext: No LE edema Neuro: A&O x4  Assessment/Plan:  Principal Problem:   Influenza A  Influenza A  Sinus Tachycardia 2/2 influenza Patient was febrile to 103.60F overnight and still appears very uncomfortable on exam today. Lactate within normal limits today, troponin still elevated but rate of rise is not consistent with ischemic disease. Patient remains mildly tachycardic and tachypneic, although still maintaining O2 saturation on room air. Nursing note states she was tachycardic to 115 while ambulating today with no change in respiratory status. Overall she is still acutely ill likely exacerbated by dehydration in the setting of influenza. Plan to continue to monitor on supportive care and Tamiflu.  -IV LR @125mL /hr for 10h -Tamiflu 75mg  BID (2/5) -Tylenol 650mg  PRN  Paraesthesias in Hands and Feet  Suspect this is related to her acute illness vs cervical radiculopathy.  -Follow up outpatient with PCP  Full Code DVT Prophylaxis: Xarelto Diet: Regular Family Updates: Husband at bedside    LOS: 0 days   Spero Curb, Medical Student 07/24/2022, 2:51 PM

## 2022-07-24 NOTE — ED Notes (Signed)
ED TO INPATIENT HANDOFF REPORT  ED Nurse Name and Phone #: Brett Albino [phone]   S Name/Age/Gender Brittany Perez 37 y.o. female Room/Bed: 040C/040C  Code Status   Code Status: Full Code  Home/SNF/Other Home Patient oriented to: self, place, time, and situation Is this baseline? Yes   Triage Complete: Triage complete  Chief Complaint Influenza A [J10.1]  Triage Note Pt reports cough, body aches, and fever that started yesterday.    Allergies No Known Allergies  Level of Care/Admitting Diagnosis ED Disposition     ED Disposition  Admit   Condition  --   Comment  Hospital Area: [facility] [[id number]]  Level of Care: Med-Surg [16]  May place patient in observation at [facility] or [facility] if equivalent level of care is available:: No  Covid Evaluation: Confirmed COVID Negative  Diagnosis: Influenza A [885027]  Admitting Physician: Tyson Alias [[id number]]  Attending Physician: Tyson Alias [phone]          B Medical/Surgery History Past Medical History:  Diagnosis Date   Preterm labor    Past Surgical History:  Procedure Laterality Date   CESAREAN SECTION     CESAREAN SECTION N/A 03/09/2014   Procedure: CESAREAN SECTION;  Surgeon: Levie Heritage, DO;  Location: [facility];  Service: Obstetrics;  Laterality: N/A;     A IV Location/Drains/Wounds Patient Lines/Drains/Airways Status     Active Line/Drains/Airways     Name Placement date Placement time Site Days   Peripheral IV 07/23/22 20 G 1.88" Anterior;Right Forearm 07/23/22  1444  Forearm  1   Incision (Closed) 03/09/14 Abdomen Other (Comment) 03/09/14  1214  -- 3059            Intake/Output Last 24 hours No intake or output data in the 24 hours ending 07/24/22 1356  Labs/Imaging Results for orders placed or performed during the hospital encounter of 07/23/22 (from the past 48 hour(s))  Resp panel by RT-PCR (RSV, Flu A&B, Covid) Anterior Nasal Swab     Status:  Abnormal   Collection Time: 07/23/22  8:09 AM   Specimen: Anterior Nasal Swab  Result Value Ref Range   SARS Coronavirus 2 by RT PCR NEGATIVE NEGATIVE    Comment: (NOTE) SARS-CoV-2 target nucleic acids are NOT DETECTED.  The SARS-CoV-2 RNA is generally detectable in upper respiratory specimens during the acute phase of infection. The lowest concentration of SARS-CoV-2 viral copies this assay can detect is 138 copies/mL. A negative result does not preclude SARS-Cov-2 infection and should not be used as the sole basis for treatment or other patient management decisions. A negative result may occur with  improper specimen collection/handling, submission of specimen other than nasopharyngeal swab, presence of viral mutation(s) within the areas targeted by this assay, and inadequate number of viral copies(<138 copies/mL). A negative result must be combined with clinical observations, patient history, and epidemiological information. The expected result is Negative.  Fact Sheet for Patients:  BloggerCourse.com  Fact Sheet for Healthcare Providers:  SeriousBroker.it  This test is no t yet approved or cleared by the [place] FDA and  has been authorized for detection and/or diagnosis of SARS-CoV-2 by FDA under an Emergency Use Authorization (EUA). This EUA will remain  in effect (meaning this test can be used) for the duration of the COVID-19 declaration under Section 564(b)(1) of the Act, 21 U.S.C.section 360bbb-3(b)(1), unless the authorization is terminated  or revoked sooner.       Influenza A by PCR POSITIVE (A)  NEGATIVE   Influenza B by PCR NEGATIVE NEGATIVE    Comment: (NOTE) The Xpert Xpress SARS-CoV-2/FLU/RSV plus assay is intended as an aid in the diagnosis of influenza from Nasopharyngeal swab specimens and should not be used as a sole basis for treatment. Nasal washings and aspirates are unacceptable for Xpert Xpress  SARS-CoV-2/FLU/RSV testing.  Fact Sheet for Patients: EntrepreneurPulse.com.au  Fact Sheet for Healthcare Providers: IncredibleEmployment.be  This test is not yet approved or cleared by the [place] FDA and has been authorized for detection and/or diagnosis of SARS-CoV-2 by FDA under an Emergency Use Authorization (EUA). This EUA will remain in effect (meaning this test can be used) for the duration of the COVID-19 declaration under Section 564(b)(1) of the Act, 21 U.S.C. section 360bbb-3(b)(1), unless the authorization is terminated or revoked.     Resp Syncytial Virus by PCR NEGATIVE NEGATIVE    Comment: (NOTE) Fact Sheet for Patients: EntrepreneurPulse.com.au  Fact Sheet for Healthcare Providers: IncredibleEmployment.be  This test is not yet approved or cleared by the [place] FDA and has been authorized for detection and/or diagnosis of SARS-CoV-2 by FDA under an Emergency Use Authorization (EUA). This EUA will remain in effect (meaning this test can be used) for the duration of the COVID-19 declaration under Section 564(b)(1) of the Act, 21 U.S.C. section 360bbb-3(b)(1), unless the authorization is terminated or revoked.  Performed at [facility], [place] [address] [place], [place] [address]   Basic metabolic panel     Status: Abnormal   Collection Time: 07/23/22  8:20 AM  Result Value Ref Range   Sodium 135 135 - 145 mmol/L   Potassium 3.8 3.5 - 5.1 mmol/L   Chloride 100 98 - 111 mmol/L   CO2 22 22 - 32 mmol/L   Glucose, Bld 110 (H) 70 - 99 mg/dL    Comment: Glucose reference range applies only to samples taken after fasting for at least 8 hours.   BUN 11 6 - 20 mg/dL   Creatinine, Ser 1.00 0.44 - 1.00 mg/dL   Calcium 9.4 8.9 - 10.3 mg/dL   GFR, Estimated >60 >60 mL/min    Comment: (NOTE) Calculated using the CKD-EPI Creatinine Equation (2021)    Anion gap 13 5 - 15     Comment: Performed at [facility] [address] [place], [place] [address]  CBC     Status: Abnormal   Collection Time: 07/23/22  8:20 AM  Result Value Ref Range   WBC 9.3 4.0 - 10.5 K/uL   RBC 5.06 3.87 - 5.11 MIL/uL   Hemoglobin 12.8 12.0 - 15.0 g/dL   HCT 40.7 36.0 - 46.0 %   MCV 80.4 80.0 - 100.0 fL   MCH 25.3 (L) 26.0 - 34.0 pg   MCHC 31.4 30.0 - 36.0 g/dL   RDW 14.3 11.5 - 15.5 %   Platelets 272 150 - 400 K/uL   nRBC 0.0 0.0 - 0.2 %    Comment: Performed at [facility], [place] [address] [place], [place] [address]  Urinalysis, Routine w reflex microscopic -Urine, Clean Catch     Status: Abnormal   Collection Time: 07/23/22 12:35 PM  Result Value Ref Range   Color, Urine YELLOW YELLOW   APPearance HAZY (A) CLEAR   Specific Gravity, Urine 1.023 1.005 - 1.030   pH 5.0 5.0 - 8.0   Glucose, UA NEGATIVE NEGATIVE mg/dL   Hgb urine dipstick MODERATE (A) NEGATIVE   Bilirubin Urine NEGATIVE NEGATIVE  Ketones, ur NEGATIVE NEGATIVE mg/dL   Protein, ur 30 (A) NEGATIVE mg/dL   Nitrite NEGATIVE NEGATIVE   Leukocytes,Ua NEGATIVE NEGATIVE   RBC / HPF 0-5 0 - 5 RBC/hpf   WBC, UA 0-5 0 - 5 WBC/hpf   Bacteria, UA RARE (A) NONE SEEN   Squamous Epithelial / HPF 6-10 0 - 5 /HPF   Mucus PRESENT     Comment: Performed at [facility], [place] [address], [place], [place] [address]  Pregnancy, urine     Status: None   Collection Time: 07/23/22 12:36 PM  Result Value Ref Range   Preg Test, Ur NEGATIVE NEGATIVE    Comment:        THE SENSITIVITY OF THIS METHODOLOGY IS >20 mIU/mL. Performed at [facility], [place] [address], [place], [place] [address]   Lactic acid, plasma     Status: Abnormal   Collection Time: 07/23/22  5:42 PM  Result Value Ref Range   Lactic Acid, Venous 2.0 (HH) 0.5 - 1.9 mmol/L    Comment: CRITICAL RESULT CALLED TO, READ BACK BY AND VERIFIED WITH A. ROSS RN 07/23/22 @1840  BY J. WHITE Performed at [facility] [address]  [place], [place] [address]   Troponin I (High Sensitivity)     Status: Abnormal   Collection Time: 07/23/22  5:42 PM  Result Value Ref Range   Troponin I (High Sensitivity) 21 (H) <18 ng/L    Comment: (NOTE) Elevated high sensitivity troponin I (hsTnI) values and significant  changes across serial measurements may suggest ACS but many other  chronic and acute conditions are known to elevate hsTnI results.  Refer to the "Links" section for chest pain algorithms and additional  guidance. Performed at [facility], [place] [address] [place], [place] [address]   CK     Status: None   Collection Time: 07/23/22  5:42 PM  Result Value Ref Range   Total CK 199 38 - 234 U/L    Comment: Performed at [facility], [place] [address] [place], [place] [address]  Lactic acid, plasma     Status: None   Collection Time: 07/23/22  8:11 PM  Result Value Ref Range   Lactic Acid, Venous 1.3 0.5 - 1.9 mmol/L    Comment: Performed at [facility] [address] [place], [place] [address]  Troponin I (High Sensitivity)     Status: Abnormal   Collection Time: 07/23/22  8:11 PM  Result Value Ref Range   Troponin I (High Sensitivity) 47 (H) <18 ng/L    Comment: RESULT CALLED TO, READ BACK BY AND VERIFIED WITH M.REGEIS,RN. 2113 07/23/22. LPAIT (NOTE) Elevated high sensitivity troponin I (hsTnI) values and significant  changes across serial measurements may suggest ACS but many other  chronic and acute conditions are known to elevate hsTnI results.  Refer to the "Links" section for chest pain algorithms and additional  guidance. Performed at [facility], [place] [address] [place], [place] [address]   Troponin I (High Sensitivity)     Status: Abnormal   Collection Time: 07/23/22 10:50 PM  Result Value Ref Range   Troponin I (High Sensitivity) 66 (H) <18 ng/L    Comment: DELTA CHECK NOTED (NOTE) Elevated high sensitivity troponin I (hsTnI) values and significant  changes across serial  measurements may suggest ACS but many other  chronic and acute conditions are known to elevate hsTnI results.  Refer to the "Links" section  for chest pain algorithms and additional  guidance. Performed at [facility], [address] [place], [place] [address]   HIV Antibody (routine testing w rflx)     Status: None   Collection Time: 07/24/22  1:38 AM  Result Value Ref Range   HIV Screen 4th Generation wRfx Non Reactive Non Reactive    Comment: Performed at [facility], [address] [place], [place] [address]  Basic metabolic panel     Status: Abnormal   Collection Time: 07/24/22  1:38 AM  Result Value Ref Range   Sodium 135 135 - 145 mmol/L   Potassium 4.2 3.5 - 5.1 mmol/L    Comment: HEMOLYSIS AT THIS LEVEL MAY AFFECT RESULT   Chloride 107 98 - 111 mmol/L   CO2 20 (L) 22 - 32 mmol/L   Glucose, Bld 93 70 - 99 mg/dL    Comment: Glucose reference range applies only to samples taken after fasting for at least 8 hours.   BUN 14 6 - 20 mg/dL   Creatinine, Ser 0.98 0.44 - 1.00 mg/dL   Calcium 8.1 (L) 8.9 - 10.3 mg/dL   GFR, Estimated >11 >91 mL/min    Comment: (NOTE) Calculated using the CKD-EPI Creatinine Equation (2021)    Anion gap 8 5 - 15    Comment: Performed at [facility], [address], [place], [place] [address]  CBC     Status: Abnormal   Collection Time: 07/24/22  1:38 AM  Result Value Ref Range   WBC 12.0 (H) 4.0 - 10.5 K/uL   RBC 4.23 3.87 - 5.11 MIL/uL   Hemoglobin 11.0 (L) 12.0 - 15.0 g/dL   HCT 56.2 (L) 13.0 - 86.5 %   MCV 77.5 (L) 80.0 - 100.0 fL   MCH 26.0 26.0 - 34.0 pg   MCHC 33.5 30.0 - 36.0 g/dL   RDW 78.4 69.6 - 29.5 %   Platelets 222 150 - 400 K/uL   nRBC 0.0 0.0 - 0.2 %    Comment: Performed at [facility], [address], [place], [place] [address]  Troponin I (High Sensitivity)     Status: Abnormal   Collection Time: 07/24/22  1:38 AM  Result Value Ref Range   Troponin I (High Sensitivity) 79 (H) <18 ng/L     Comment: (NOTE) Elevated high sensitivity troponin I (hsTnI) values and significant  changes across serial measurements may suggest ACS but many other  chronic and acute conditions are known to elevate hsTnI results.  Refer to the "Links" section for chest pain algorithms and additional  guidance. Performed at [facility], [address] [place], [place] [address]   Troponin I (High Sensitivity)     Status: Abnormal   Collection Time: 07/24/22  4:29 AM  Result Value Ref Range   Troponin I (High Sensitivity) 95 (H) <18 ng/L    Comment: (NOTE) Elevated high sensitivity troponin I (hsTnI) values and significant  changes across serial measurements may suggest ACS but many other  chronic and acute conditions are known to elevate hsTnI results.  Refer to the "Links" section for chest pain algorithms and additional  guidance. Performed at [facility], [address] [place], [place] [address]   Lactic acid, plasma     Status: None   Collection Time: 07/24/22  5:35 AM  Result Value Ref Range   Lactic Acid, Venous 0.8 0.5 - 1.9 mmol/L    Comment: Performed at  [facility], [place] [address], [place], [place] [address]   DG Chest 2 View  Result Date: 07/23/2022 CLINICAL DATA:  cough, body aches, and fever that started yesterday. EXAM: CHEST - 2 VIEW COMPARISON:  01/19/2009 FINDINGS: Lungs are clear. Heart size and mediastinal contours are within normal limits. No effusion. Visualized bones unremarkable. IMPRESSION: No acute cardiopulmonary disease. Electronically Signed   By: Lucrezia Europe M.D.   On: 07/23/2022 08:54    Pending Labs Unresulted Labs (From admission, onward)    None       Vitals/Pain Today's Vitals   07/24/22 1300 07/24/22 1330 07/24/22 1341 07/24/22 1345  BP: 101/64 (!) 114/51  95/68  Pulse: 94 91  91  Resp: 16 (!) 22  20  Temp:   97.7 F (36.5 C)   TempSrc:   Oral   SpO2: 100% 93%  92%  PainSc:        Isolation Precautions Airborne and  Contact precautions  Medications Medications  rivaroxaban (XARELTO) tablet 10 mg (10 mg Oral Given 07/24/22 1016)  acetaminophen (TYLENOL) tablet 650 mg (650 mg Oral Given 07/24/22 0144)    Or  acetaminophen (TYLENOL) suppository 650 mg ( Rectal See Alternative 07/24/22 0144)  senna-docusate (Senokot-S) tablet 1 tablet (has no administration in time range)  lactated ringers infusion (0 mLs Intravenous Stopped 07/24/22 0210)  oseltamivir (TAMIFLU) capsule 75 mg (75 mg Oral Given 07/24/22 1016)  dextromethorphan-guaiFENesin (MUCINEX DM) 30-600 MG per 12 hr tablet 1 tablet (1 tablet Oral Given 07/23/22 2311)  lactated ringers infusion ( Intravenous Rate/Dose Change 07/24/22 1122)  acetaminophen (TYLENOL) tablet 650 mg (650 mg Oral Given 07/23/22 0828)  sodium chloride 0.9 % bolus 2,000 mL (0 mLs Intravenous Stopped 07/23/22 1736)  ketorolac (TORADOL) 15 MG/ML injection 15 mg (15 mg Intravenous Given 07/23/22 1511)  acetaminophen (TYLENOL) tablet 1,000 mg (1,000 mg Oral Given 07/23/22 1752)    Mobility walks     Focused Assessments Pulmonary Assessment Handoff:  Lung sounds: Bilateral Breath Sounds: Clear O2 Device: Room Air      R Recommendations: See Admitting Provider Note  Report given to:   Additional Notes:  pt speaks vietnamese dialect. Interpretor is needed.

## 2022-07-25 ENCOUNTER — Other Ambulatory Visit (HOSPITAL_COMMUNITY): Payer: Self-pay

## 2022-07-25 DIAGNOSIS — R000 Tachycardia, unspecified: Secondary | ICD-10-CM | POA: Diagnosis not present

## 2022-07-25 DIAGNOSIS — J101 Influenza due to other identified influenza virus with other respiratory manifestations: Secondary | ICD-10-CM | POA: Diagnosis not present

## 2022-07-25 MED ORDER — DM-GUAIFENESIN ER 30-600 MG PO TB12
1.0000 | ORAL_TABLET | Freq: Two times a day (BID) | ORAL | 0 refills | Status: AC | PRN
  Filled 2022-07-25: qty 10, 5d supply, fill #0

## 2022-07-25 MED ORDER — OSELTAMIVIR PHOSPHATE 75 MG PO CAPS
75.0000 mg | ORAL_CAPSULE | Freq: Two times a day (BID) | ORAL | 0 refills | Status: AC
  Filled 2022-07-25: qty 5, 3d supply, fill #0

## 2022-07-25 NOTE — Plan of Care (Addendum)
Iv has been removed. Discharge paperwork reviewed with patient at this time. No further requests have been made.   Problem: Education: Goal: Knowledge of General Education information will improve Description: Including pain rating scale, medication(s)/side effects and non-pharmacologic comfort measures Outcome: Adequate for Discharge   Problem: Health Behavior/Discharge Planning: Goal: Ability to manage health-related needs will improve Outcome: Adequate for Discharge   Problem: Clinical Measurements: Goal: Ability to maintain clinical measurements within normal limits will improve Outcome: Adequate for Discharge Goal: Will remain free from infection Outcome: Adequate for Discharge Goal: Diagnostic test results will improve Outcome: Adequate for Discharge Goal: Respiratory complications will improve Outcome: Adequate for Discharge Goal: Cardiovascular complication will be avoided Outcome: Adequate for Discharge   Problem: Activity: Goal: Risk for activity intolerance will decrease Outcome: Adequate for Discharge   Problem: Nutrition: Goal: Adequate nutrition will be maintained Outcome: Adequate for Discharge   Problem: Coping: Goal: Level of anxiety will decrease Outcome: Adequate for Discharge   Problem: Elimination: Goal: Will not experience complications related to bowel motility Outcome: Adequate for Discharge Goal: Will not experience complications related to urinary retention Outcome: Adequate for Discharge   Problem: Pain Managment: Goal: General experience of comfort will improve Outcome: Adequate for Discharge   Problem: Safety: Goal: Ability to remain free from injury will improve Outcome: Adequate for Discharge   Problem: Skin Integrity: Goal: Risk for impaired skin integrity will decrease Outcome: Adequate for Discharge

## 2022-07-25 NOTE — Discharge Summary (Signed)
Name: Brittany Perez MRN: [id number] DOB: 1985/09/24 37 y.o. PCP: Patient, No Pcp Per  Date of Admission: 07/23/2022  7:57 AM Date of Discharge: 07/25/2022 Attending Physician: Axel Filler, *  Discharge Diagnosis: 1. Principal Problem:   Influenza A   Discharge Medications: Allergies as of 07/25/2022   No Known Allergies      Medication List     STOP taking these medications    ibuprofen 600 MG tablet Commonly known as: ADVIL   sertraline 50 MG tablet Commonly known as: Zoloft       TAKE these medications    dextromethorphan-guaiFENesin 30-600 MG 12hr tablet Commonly known as: MUCINEX DM Take 1 tablet by mouth 2 (two) times daily as needed for up to 5 days for cough.   meclizine 25 MG tablet Commonly known as: ANTIVERT Take 1 tablet (25 mg total) by mouth 3 (three) times daily as needed for dizziness.   oseltamivir 75 MG capsule Commonly known as: TAMIFLU Take 1 capsule (75 mg total) by mouth 2 (two) times daily for 5 doses.   prenatal multivitamin Tabs tablet Take 1 tablet by mouth daily at 12 noon.        Disposition and follow-up:   Brittany Perez was discharged from [facility] in Good condition.  At the hospital follow up visit please address:  1.  Influenza A Patient admitted for persistent sinus tachycardia in the setting of influenza A, with concern for possible viral myocarditis. Patient discharged with Tamiflu, please follow up for symptom resolution.   2. Paresthesia in Hands/Feet Patient works as a Regulatory affairs officer, endorsed numbness/tingling in hands and feet on admission and has previously been seen at [facility] for neck pain. Consider if further workup needed.   3. Concern for OSA Patient may warrant sleep study.   4.  Labs / imaging needed at time of follow-up: None  5.  Pending labs/ test needing follow-up: None  Follow-up Appointments:  Follow-up Information     Serita Butcher, MD .   Contact information: [place] [address] [phone]                  Hospital Course by problem list:  Influenza A Sinus tachycardia in setting of influenza Patient presented to the ED on 1/29 for 1 day of fever, chills, myalgias, and productive cough and was febrile to 103 with tachycardia and hypotension. Troponins were elevated, concerning for possible viral myocarditis. No ischemic changes on EKG. Denies any chest pain. No evidence of pneumonia. She remained tachycardic and febrile overnight after admission, but improved with supportive care and Tamiflu.   Paraesthesia in hands/feet Patient works as a Regulatory affairs officer, endorsed numbness/tingling in hands and feet on admission and has previously been seen at [facility] for neck pain. Plan for outpatient follow-up.  Concern for OSA Patient was loudly snoring this morning. BMI of 30. May warrant outpatient evaluation for possible sleep study.    Discharge Subjective: Today patient states she is feeling better than yesterday, still endorses sore throat and decreased appetite but can tolerate oral intake. Denies any nausea/vomiting. She reports feeling ready to go home today.   Discharge Exam:   BP (!) 108/50 (BP Location: Right Arm)   Pulse 70   Temp 98.1 F (36.7 C) (Oral)   Resp 20   SpO2 94%   General: Tired-appearing, in no acute distress CV: RRR, no murmurs, rubs, or gallops Pulm: No respiratory distress on room air, lungs clear to auscultation  bilaterally Abd: Normoactive bowel sounds. Soft, non-tender, non-distended.  Skin: Warm and dry Neuro: A&O x4 Psych: Pleasant, appropriate affect  Pertinent Labs, Studies, and Procedures:      Latest Ref Rng & Units 07/24/2022    1:38 AM 07/23/2022    8:20 AM 03/10/2014    5:31 AM  CBC  WBC 4.0 - 10.5 K/uL 12.0  9.3  15.2   Hemoglobin 12.0 - 15.0 g/dL 11.0  12.8  9.2   Hematocrit 36.0 - 46.0 % 32.8  40.7  27.6   Platelets 150 - 400 K/uL 222  272  239        Latest Ref Rng & Units  07/24/2022    1:38 AM 07/23/2022    8:20 AM  BMP  Glucose 70 - 99 mg/dL 93  110   BUN 6 - 20 mg/dL 14  11   Creatinine 0.44 - 1.00 mg/dL 0.86  1.00   Sodium 135 - 145 mmol/L 135  135   Potassium 3.5 - 5.1 mmol/L 4.2  3.8   Chloride 98 - 111 mmol/L 107  100   CO2 22 - 32 mmol/L 20  22   Calcium 8.9 - 10.3 mg/dL 8.1  9.4    Discharge Instructions: [name],  You were in the hospital because you had a bad case of the flu. We gave you lots of fluids through your IV and started a medicine to treat the flu. That medicine is called Tamiflu and you should keep taking it for the next couple of days - you will take one tablet twice a day until it runs out (take one dose tonight, since you got a dose in the hospital this morning). You can also take Mucinex twice a day, if needed, for your cough. This medicine seemed to help you while you were in the hospital.  We have also given you a work note, and think that you should be good to return to work on Friday.  You will also be seeing [place] in our clinic on the ground floor of the hospital - we will be your new doctors!  You have an appointment on  February 7th at 10:15 AM (please arrive 15 minutes early). If you have any questions or concerns, call our clinic at [phone] or after hours call [phone] and ask for the internal medicine resident on call.  We are glad you are feeling better!    Signed: Spero Curb, Medical Student 07/25/2022, 9:31 AM   Pager: [phone]   Attestation for Student Documentation:  I personally was present and performed or re-performed the history, physical exam and medical decision-making activities of this service and have verified that the service and findings are accurately documented in the student's note.  Angelique Blonder, DO 07/25/2022, 10:11 AM

## 2022-07-25 NOTE — Care Management (Signed)
  Transition of Care [facility]) Screening Note   Patient Details  Name: Anicka Stuckert Date of Birth: Mar 16, 1986   Transition of Care [facility]) CM/SW Contact:    Levonne Lapping, RN Phone Number: 07/25/2022, 10:12 AM    Transition of Care Department [facility]) has reviewed patient and no TOC needs have been identified at this time. We will continue to monitor patient advancement through interdisciplinary progression rounds. If new patient transition needs arise, please place a TOC consult.  DC to Home with Husband today

## 2022-07-25 NOTE — Progress Notes (Incomplete)
   CC: Hospital (influenza A) Follow-up  HPI:   [name] is a 37 y.o. female without a significant past medical history who presents for hospital follow-up and care establishment. She was recently 1-29 hospitalized for influenza A infection, responded well to medical management, and then discharged home with oseltamivir on 1-31 in good condition.    Medical history Vertigo  Medications none  Surgical history Cesarean section x2  Family history Mother - unknown Father - unknown Siblings - unknown  Occupation Regulatory affairs officer for a [employer] With husband and three children Feels safe at home  Drugs Tobacco - none Alcohol - none Marijuana - none Cocaine - none Heroin - none     Past Medical History:  Diagnosis Date   Preterm labor      Review of Systems:    Reports cough Denies breath shortness, chest pain, fever, chills, sweats, wheezing, sore throat    Physical Exam:  Vitals:   08/01/22 1041  BP: 125/83  Pulse: 92  Temp: 98.4 F (36.9 C)  TempSrc: Oral  SpO2: 100%  Weight: 192 lb 4.8 oz (87.2 kg)  Height: 5\' 3"  (1.6 m)    General:   awake and alert, sitting comfortably in chair, cooperative, not in acute distress Mouth:   nasopharynx without erythema or exudate Nose:   symmetrical and without mucosal inflammation, no external lesions or discharge Lungs:   normal respiratory effort, breathing unlabored, symmetrical chest rise, no crackles or wheezing Cardiac:   regular rate and rhythm, normal S1 and S2 Neurologic:   oriented to person-place-time, moving all extremities, no gross focal deficits Psychiatric:   euthymic mood with congruent affect, intelligible speech    Assessment & Plan:   Influenza A Patient was recently hospitalized for influenza A infection, possibly complicated by viral myocarditis. She was discharged with a short course of oseltamivir and dextromethorphan-guaifenesin. Since returning home, her symptoms have improved.  Denies appreciable breath shortness, chest pain, fever, chills, sweats, wheezing, and sore throat. Her cough has also improved, but it persists and disrupts sleep. The dextromethorphan-guaifenesin was largely ineffective in providing cough relief. Pulmonary exam was unremarkable without any crackles, wheezing, or course breath sounds. Vitals all within normal limits.  - Prescribe hydrocodone-homatropine 5-1.5mg  q4 PRN for seven days    Obesity (BMI 30-39.9) Patient has BMI 34 and was observed snoring while hospitalized. She further reports daytime fatigue and nighttime awakenings. Discussed possibility of obstructive sleep apnea and recommended evaluation with sleep study. The patient declined, but is open to continuing this discussion during upcoming visits.  Discussed diagnosis of obesity and some of the associated medical conditions associated with it. Patient expressed an interest in losing weight, counseling was provided on effective weight loss strategies.  - Recommend sleep study      See Encounters Tab for problem based charting.  Patient discussed with Dr. Heber [place]

## 2022-08-01 ENCOUNTER — Encounter: Payer: Self-pay | Admitting: Student

## 2022-08-01 ENCOUNTER — Ambulatory Visit: Payer: BC Managed Care – PPO | Admitting: Student

## 2022-08-01 ENCOUNTER — Other Ambulatory Visit: Payer: Self-pay

## 2022-08-01 VITALS — BP 125/83 | HR 92 | Temp 98.4°F | Ht 63.0 in | Wt 192.3 lb

## 2022-08-01 DIAGNOSIS — Z6830 Body mass index (BMI) 30.0-30.9, adult: Secondary | ICD-10-CM | POA: Diagnosis not present

## 2022-08-01 DIAGNOSIS — E669 Obesity, unspecified: Secondary | ICD-10-CM | POA: Diagnosis not present

## 2022-08-01 DIAGNOSIS — J101 Influenza due to other identified influenza virus with other respiratory manifestations: Secondary | ICD-10-CM | POA: Diagnosis not present

## 2022-08-01 MED ORDER — HYDROCODONE BIT-HOMATROP MBR 5-1.5 MG PO TABS: 1.0000 | ORAL_TABLET | ORAL | 0 refills | Status: DC | PRN

## 2022-08-01 NOTE — Assessment & Plan Note (Signed)
Patient was recently hospitalized for influenza A infection, possibly complicated by viral myocarditis. She was discharged with a short course of oseltamivir and dextromethorphan-guaifenesin. Since returning home, her symptoms have improved. Denies appreciable breath shortness, chest pain, fever, chills, sweats, wheezing, and sore throat. Her cough has also improved, but it persists and disrupts sleep. The dextromethorphan-guaifenesin was largely ineffective in providing cough relief. Pulmonary exam was unremarkable without any crackles, wheezing, or course breath sounds. Vitals all within normal limits.  - Prescribe hydrocodone-homatropine 5-1.5mg  q4 PRN for seven days

## 2022-08-01 NOTE — Assessment & Plan Note (Addendum)
Patient has BMI 34 and was observed snoring while hospitalized. She further reports daytime fatigue and nighttime awakenings. Discussed possibility of obstructive sleep apnea and recommended evaluation with sleep study. The patient declined, but is open to continuing this discussion during upcoming visits.  Discussed diagnosis of obesity and some of the associated medical conditions associated with it. Patient expressed an interest in losing weight, counseling was provided on effective weight loss strategies.  - Recommend sleep study

## 2022-08-01 NOTE — Patient Instructions (Signed)
  Thank you, Ms.Benita Gutter, for allowing [place] to provide your care today. Today we discussed . . .  > Cough       - it is good to hear that you are feeling better since your recent hospitalization       - we are prescribing you a short course of Hycodan, which should help your lingering cough       - please sleep enough and drink plenty of water to help your body fight the infection     I have ordered the following labs for you:  Lab Orders  No laboratory test(s) ordered today      Tests ordered today:  none   Referrals ordered today:   Referral Orders  No referral(s) requested today      I have ordered the following medication/changed the following medications:   Stop the following medications: There are no discontinued medications.   Start the following medications: No orders of the defined types were placed in this encounter.     Follow up: 6 months    Remember:  Please continue to sleep enough and drink plenty of fluids to help your body recover. We are prescribing you a one-week supply of Hycodan, which should help suppress your cough. We will see you again in about six months!   Should you have any questions or concerns please call the internal medicine clinic at [phone].     Roswell Nickel, MD [place]

## 2022-08-02 ENCOUNTER — Telehealth: Payer: Self-pay

## 2022-08-02 MED ORDER — HYDROCODONE BIT-HOMATROP MBR 5-1.5 MG PO TABS: 1.0000 | ORAL_TABLET | ORAL | 0 refills | Status: DC | PRN

## 2022-08-02 NOTE — Telephone Encounter (Addendum)
Dr.Harper looks like rx didn't send as normal the order class on rx says print please resend rx to the pharmacy.

## 2022-08-06 NOTE — Telephone Encounter (Signed)
Yes please resend.

## 2022-08-07 NOTE — Progress Notes (Signed)
Internal Medicine Clinic Attending  Case discussed with the resident at the time of the visit.  We reviewed the resident's history and exam and pertinent patient test results.  I agree with the assessment, diagnosis, and plan of care documented in the resident's note.  

## 2022-08-09 ENCOUNTER — Other Ambulatory Visit: Payer: Self-pay | Admitting: Student

## 2022-08-09 MED ORDER — HYDROCODONE BIT-HOMATROP MBR 5-1.5 MG PO TABS: 1.0000 | ORAL_TABLET | ORAL | 0 refills | Status: DC | PRN

## 2022-10-31 ENCOUNTER — Emergency Department (HOSPITAL_COMMUNITY): Payer: BC Managed Care – PPO

## 2022-10-31 ENCOUNTER — Encounter (HOSPITAL_COMMUNITY): Payer: Self-pay

## 2022-10-31 ENCOUNTER — Other Ambulatory Visit: Payer: Self-pay

## 2022-10-31 ENCOUNTER — Emergency Department (HOSPITAL_COMMUNITY)
Admission: EM | Admit: 2022-10-31 | Discharge: 2022-10-31 | Disposition: A | Discharge status: Home / Self Care | Payer: BC Managed Care – PPO | Attending: Emergency Medicine | Admitting: Emergency Medicine

## 2022-10-31 DIAGNOSIS — D649 Anemia, unspecified: Secondary | ICD-10-CM | POA: Insufficient documentation

## 2022-10-31 DIAGNOSIS — N200 Calculus of kidney: Secondary | ICD-10-CM | POA: Diagnosis not present

## 2022-10-31 DIAGNOSIS — N132 Hydronephrosis with renal and ureteral calculous obstruction: Secondary | ICD-10-CM | POA: Insufficient documentation

## 2022-10-31 DIAGNOSIS — R1084 Generalized abdominal pain: Secondary | ICD-10-CM | POA: Diagnosis not present

## 2022-10-31 DIAGNOSIS — N1330 Unspecified hydronephrosis: Secondary | ICD-10-CM | POA: Diagnosis not present

## 2022-10-31 DIAGNOSIS — D72829 Elevated white blood cell count, unspecified: Secondary | ICD-10-CM | POA: Insufficient documentation

## 2022-10-31 DIAGNOSIS — R1031 Right lower quadrant pain: Secondary | ICD-10-CM | POA: Diagnosis not present

## 2022-10-31 DIAGNOSIS — R109 Unspecified abdominal pain: Secondary | ICD-10-CM | POA: Diagnosis not present

## 2022-10-31 LAB — COMPREHENSIVE METABOLIC PANEL
ALT: 35 U/L (ref 0–44)
AST: 28 U/L (ref 15–41)
Albumin: 3.7 g/dL (ref 3.5–5.0)
Alkaline Phosphatase: 52 U/L (ref 38–126)
Anion gap: 11 (ref 5–15)
BUN: 11 mg/dL (ref 6–20)
CO2: 22 mmol/L (ref 22–32)
Calcium: 9.2 mg/dL (ref 8.9–10.3)
Chloride: 104 mmol/L (ref 98–111)
Creatinine, Ser: 0.94 mg/dL (ref 0.44–1.00)
GFR, Estimated: >60 mL/min (ref >60)
Glucose, Bld: 120 mg/dL — ABNORMAL HIGH (ref 70–99)
Potassium: 3.7 mmol/L (ref 3.5–5.1)
Sodium: 137 mmol/L (ref 135–145)
Total Bilirubin: 0.4 mg/dL (ref 0.3–1.2)
Total Protein: 7.6 g/dL (ref 6.5–8.1)

## 2022-10-31 LAB — CBC WITH DIFFERENTIAL/PLATELET
Abs Immature Granulocytes: 0.08 10*3/uL — ABNORMAL HIGH (ref 0.00–0.07)
Basophils Absolute: 0 10*3/uL (ref 0.0–0.1)
Basophils Relative: 0 %
Eosinophils Absolute: 0.2 10*3/uL (ref 0.0–0.5)
Eosinophils Relative: 2 %
HCT: 36.8 % (ref 36.0–46.0)
Hemoglobin: 11.9 g/dL — ABNORMAL LOW (ref 12.0–15.0)
Immature Granulocytes: 1 %
Lymphocytes Relative: 24 %
Lymphs Abs: 3.5 10*3/uL (ref 0.7–4.0)
MCH: 25.5 pg — ABNORMAL LOW (ref 26.0–34.0)
MCHC: 32.3 g/dL (ref 30.0–36.0)
MCV: 79 fL — ABNORMAL LOW (ref 80.0–100.0)
Monocytes Absolute: 0.6 10*3/uL (ref 0.1–1.0)
Monocytes Relative: 5 %
Neutro Abs: 9.8 10*3/uL — ABNORMAL HIGH (ref 1.7–7.7)
Neutrophils Relative %: 68 %
Platelets: 323 10*3/uL (ref 150–400)
RBC: 4.66 MIL/uL (ref 3.87–5.11)
RDW: 14.4 % (ref 11.5–15.5)
WBC: 14.3 10*3/uL — ABNORMAL HIGH (ref 4.0–10.5)
nRBC: 0 % (ref 0.0–0.2)

## 2022-10-31 LAB — URINALYSIS, ROUTINE W REFLEX MICROSCOPIC
Bilirubin Urine: NEGATIVE
Glucose, UA: NEGATIVE mg/dL
Ketones, ur: NEGATIVE mg/dL
Leukocytes,Ua: NEGATIVE
Nitrite: NEGATIVE
Protein, ur: NEGATIVE mg/dL
Specific Gravity, Urine: 1.023 (ref 1.005–1.030)
pH: 6 (ref 5.0–8.0)

## 2022-10-31 LAB — LIPASE, BLOOD: Lipase: 28 U/L (ref 11–51)

## 2022-10-31 LAB — I-STAT BETA HCG BLOOD, ED (MC, WL, AP ONLY): I-stat hCG, quantitative: <5 m[IU]/mL (ref <5)

## 2022-10-31 MED ORDER — SODIUM CHLORIDE 0.9 % IV BOLUS
1000.0000 mL | Freq: Once | INTRAVENOUS | Status: AC
  Administered 2022-10-31: 1000 mL via INTRAVENOUS

## 2022-10-31 MED ORDER — HYDROMORPHONE HCL 1 MG/ML IJ SOLN
1.0000 mg | Freq: Once | INTRAMUSCULAR | Status: AC
  Administered 2022-10-31: 1 mg via INTRAVENOUS
  Filled 2022-10-31: qty 1

## 2022-10-31 MED ORDER — KETOROLAC TROMETHAMINE 30 MG/ML IJ SOLN
30.0000 mg | Freq: Once | INTRAMUSCULAR | Status: AC
  Administered 2022-10-31: 30 mg via INTRAVENOUS
  Filled 2022-10-31: qty 1

## 2022-10-31 MED ORDER — IOHEXOL 350 MG/ML SOLN
75.0000 mL | Freq: Once | INTRAVENOUS | Status: AC | PRN
  Administered 2022-10-31: 75 mL via INTRAVENOUS

## 2022-10-31 MED ORDER — ONDANSETRON HCL 4 MG/2ML IJ SOLN
4.0000 mg | Freq: Once | INTRAMUSCULAR | Status: AC
  Administered 2022-10-31: 4 mg via INTRAVENOUS
  Filled 2022-10-31: qty 2

## 2022-10-31 NOTE — ED Triage Notes (Signed)
Pt BIB EMS c/o right flank pain radiating to RLQ starting abt a hour ago. No hx of kidney stones.

## 2022-10-31 NOTE — ED Provider Notes (Signed)
[facility] EMERGENCY DEPARTMENT AT [facility] Provider Note   CSN: [id number] Arrival date & time: 10/31/22  0126     History  Chief Complaint  Patient presents with   Flank Pain    Brittany Perez is a 37 y.o. female.  HPI   Patient without significant medical history presents complaints of right back pain and abdominal pain.  Started about 11:30 PM yesterday, came on suddenly, describes the pain as a cramping-like sensation, associated nausea without vomiting, admits to urinary frequency without dysuria or hematuria, denies vaginal discharge or vaginal bleeding, last menstrual cycle the 22nd of last month, currently not on birth control, patient had cesarean sections no other significant abdominal surgeries, states she is still passing gas having normal bowel movements, no history of stomach ulcers or GI bleeds, no history of kidney stones.  She does endorse some indigestion general body aches denies any fevers or chills.    Home Medications Prior to Admission medications   Medication Sig Start Date End Date Taking? Authorizing Provider  HYDROcodone Bit-Homatrop MBr 5-1.5 MG TABS Take 1 tablet by mouth every 4 (four) hours as needed. 08/09/22   Crissie Sickles, MD  meclizine (ANTIVERT) 25 MG tablet Take 1 tablet (25 mg total) by mouth 3 (three) times daily as needed for dizziness. 09/27/21   Meccariello, Solmon Ice, MD  Prenatal Vit-Fe Fumarate-FA (PRENATAL MULTIVITAMIN) TABS tablet Take 1 tablet by mouth daily at 12 noon.    [provider]      Allergies    Patient has no known allergies.    Review of Systems   Review of Systems  Constitutional:  Negative for chills and fever.  Respiratory:  Negative for shortness of breath.   Cardiovascular:  Negative for chest pain.  Gastrointestinal:  Positive for abdominal pain, nausea and vomiting.  Neurological:  Negative for headaches.    Physical Exam Updated Vital Signs BP 136/78   Pulse 73   Temp (!) 97.4 F (36.3  C) (Oral)   Resp 20   Ht 5\' 3"  (1.6 m)   Wt 86.2 kg   SpO2 94%   BMI 33.66 kg/m  Physical Exam Vitals and nursing note reviewed.  Constitutional:      General: She is not in acute distress.    Appearance: She is not ill-appearing.  HENT:     Head: Normocephalic and atraumatic.     Nose: No congestion.  Eyes:     Conjunctiva/sclera: Conjunctivae normal.  Cardiovascular:     Rate and Rhythm: Normal rate and regular rhythm.     Pulses: Normal pulses.     Heart sounds: No murmur heard.    No friction rub. No gallop.  Pulmonary:     Effort: No respiratory distress.     Breath sounds: No wheezing, rhonchi or rales.  Abdominal:     Palpations: Abdomen is soft.     Tenderness: There is abdominal tenderness. There is no right CVA tenderness or left CVA tenderness.     Comments: Abdomen nondistended, soft, patient noted tenderness mainly around the periumbilical/right lower quadrant, she did have slight right flank tenderness, she had no guarding rebound tenderness or peritoneal sign.  Musculoskeletal:     Right lower leg: No edema.     Left lower leg: No edema.  Skin:    General: Skin is warm and dry.  Neurological:     Mental Status: She is alert.  Psychiatric:        Mood and Affect: Mood  normal.     ED Results / Procedures / Treatments   Labs (all labs ordered are listed, but only abnormal results are displayed) Labs Reviewed  COMPREHENSIVE METABOLIC PANEL - Abnormal; Notable for the following components:      Result Value   Glucose, Bld 120 (*)    All other components within normal limits  CBC WITH DIFFERENTIAL/PLATELET - Abnormal; Notable for the following components:   WBC 14.3 (*)    Hemoglobin 11.9 (*)    MCV 79.0 (*)    MCH 25.5 (*)    Neutro Abs 9.8 (*)    Abs Immature Granulocytes 0.08 (*)    All other components within normal limits  URINALYSIS, ROUTINE W REFLEX MICROSCOPIC - Abnormal; Notable for the following components:   Color, Urine STRAW (*)    Hgb  urine dipstick LARGE (*)    Bacteria, UA RARE (*)    All other components within normal limits  LIPASE, BLOOD  I-STAT BETA HCG BLOOD, ED ([facility], [facility], [facility] ONLY)    EKG None  Radiology CT ABDOMEN PELVIS W CONTRAST  Result Date: 10/31/2022 CLINICAL DATA:  Right lower quadrant abdominal pain EXAM: CT ABDOMEN AND PELVIS WITH CONTRAST TECHNIQUE: Multidetector CT imaging of the abdomen and pelvis was performed using the standard protocol following bolus administration of intravenous contrast. RADIATION DOSE REDUCTION: This exam was performed according to the departmental dose-optimization program which includes automated exposure control, adjustment of the mA and/or kV according to patient size and/or use of iterative reconstruction technique. CONTRAST:  75mL OMNIPAQUE IOHEXOL 350 MG/ML SOLN COMPARISON:  None Available. FINDINGS: Lower chest: No acute abnormality. Hepatobiliary: 9 mm hypodensity within the right hepatic dome is too small to accurately characterize. Liver otherwise unremarkable. Gallbladder unremarkable. No intra or extrahepatic biliary ductal dilation. Pancreas: Unremarkable Spleen: Unremarkable Adrenals/Urinary Tract: The adrenal glands are unremarkable. The kidneys are normal in size and position. Dilated right renal cortical enhancement, mild right hydronephrosis, and urothelial enhancement is seen involving the entire right ureter which likely relates to a recently passed calculus. A 3 mm calculus is seen laying dependently within the right bladder lumen. No additional nephro or urolithiasis. No hydronephrosis on the left. The bladder is otherwise unremarkable. Stomach/Bowel: Stomach is within normal limits. Appendix appears normal. No evidence of bowel wall thickening, distention, or inflammatory changes. Vascular/Lymphatic: No significant vascular findings are present. No enlarged abdominal or pelvic lymph nodes. Reproductive: Uterus and bilateral adnexa are unremarkable. Other: No abdominal  wall hernia or abnormality. No abdominopelvic ascites. Musculoskeletal: No acute or significant osseous findings. IMPRESSION: 1. Mild right hydronephrosis and urothelial enhancement involving the entire right ureter in keeping with a recently passed calculus. 3 mm calculus seen laying dependently within the right bladder lumen. No additional nephro or urolithiasis. Electronically Signed   By: Helyn Numbers M.D.   On: 10/31/2022 03:53    Procedures Procedures    Medications Ordered in ED Medications  HYDROmorphone (DILAUDID) injection 1 mg (1 mg Intravenous Given 10/31/22 0235)  ondansetron (ZOFRAN) injection 4 mg (4 mg Intravenous Given 10/31/22 0234)  sodium chloride 0.9 % bolus 1,000 mL (1,000 mLs Intravenous New Bag/Given 10/31/22 0237)  iohexol (OMNIPAQUE) 350 MG/ML injection 75 mL (75 mLs Intravenous Contrast Given 10/31/22 0340)  ketorolac (TORADOL) 30 MG/ML injection 30 mg (30 mg Intravenous Given 10/31/22 0422)    ED Course/ Medical Decision Making/ A&P  Medical Decision Making Amount and/or Complexity of Data Reviewed Labs: ordered. Radiology: ordered.  Risk Prescription drug management.   This patient presents to the ED for concern of abdominal pain, this involves an extensive number of treatment options, and is a complaint that carries with it a high risk of complications and morbidity.  The differential diagnosis includes ectopic pregnancy, ovarian torsion, kidney stone, UTI, dissection, AAA    Additional history obtained:  Additional history obtained from N/A External records from outside source obtained and reviewed including recent hospitalization   Co morbidities that complicate the patient evaluation  N/A  Social Determinants of Health:  No PCP    Lab Tests:  I Ordered, and personally interpreted labs.  The pertinent results include: CBC shows leukocytosis of 14.3, normocytic anemia hemoglobin 11.9, CMP shows glucose 120, lipase 28,  ECG negative   Imaging Studies ordered:  I ordered imaging studies including CT abdomen pelvis abdomen pelvis I independently visualized and interpreted imaging which showed shows a 3 mm stone within the bladder I agree with the radiologist interpretation   Cardiac Monitoring:  The patient was maintained on a cardiac monitor.  I personally viewed and interpreted the cardiac monitored which showed an underlying rhythm of: N/A   Medicines ordered and prescription drug management:  I ordered medication including fluids, pain medication, antiemetics I have reviewed the patients home medicines and have made adjustments as needed  Critical Interventions:  N/A   Reevaluation:  Presents with abdominal pain, presentation appears to be consistent with a kidney stone will obtain basic lab workup provide her with pain medication and fluids and reassess.  Reassessed the patient resting comfortably, pain is much improved, agreement discharge at this time    Consultations Obtained:  N/A    Test Considered:  N/A    Rule out Suspicion for ectopic pregnancy is low urine pregnancy is negative.  I doubt ovarian torsion presentation atypical, no adnexal mass present on CT imaging.  I doubt pancreatitis lipase within normal limits.  I doubt cholecystitis, cholangitis, choledocholithiasis elevation liver enzymes alk phos or T. bili she has no right upper quadrant tenderness.  Suspicion for bowel obstruction, volvulus, AAA, pyelo-, kidney stone is low at this time CT imaging negative these findings.  It is noted the patient has an elevated white count but expect this is more acute phase reactant from recently passed stone    Dispostion and problem list  After consideration of the diagnostic results and the patients response to treatment, I feel that the patent would benefit from discharge.   Kidney stone-has passed in the bladder, will likely pass through urination without difficulty,  will defer on antibiotics as there is no evidence of infection on UA.  Follow-up with urology for further evaluation strict turn precautions.             Final Clinical Impression(s) / ED Diagnoses Final diagnoses:  Kidney stone    Rx / DC Orders ED Discharge Orders     None         Carroll Sage, PA-C 10/31/22 0504    Marily Memos, MD 10/31/22 [phone]

## 2022-10-31 NOTE — Discharge Instructions (Signed)
Patient you have a small kidney stone which has passed into your bladder, this should should pass after urination.  I have given you some recommendations on how to decrease frequency of kidney stones, you may also like to follow-up with your primary doctor and/or urology for further evaluation.  Come back to the emergency department if you develop chest pain, shortness of breath, severe abdominal pain, uncontrolled nausea, vomiting, diarrhea.

## 2022-11-05 ENCOUNTER — Other Ambulatory Visit: Payer: Self-pay

## 2022-11-05 ENCOUNTER — Telehealth: Payer: Self-pay

## 2022-11-05 ENCOUNTER — Ambulatory Visit: Payer: BC Managed Care – PPO

## 2022-11-05 VITALS — BP 113/69 | HR 85 | Temp 98.2°F | Ht 63.0 in | Wt 191.3 lb

## 2022-11-05 DIAGNOSIS — N200 Calculus of kidney: Secondary | ICD-10-CM | POA: Diagnosis not present

## 2022-11-05 DIAGNOSIS — R3589 Other polyuria: Secondary | ICD-10-CM

## 2022-11-05 DIAGNOSIS — K219 Gastro-esophageal reflux disease without esophagitis: Secondary | ICD-10-CM

## 2022-11-05 DIAGNOSIS — Z0000 Encounter for general adult medical examination without abnormal findings: Secondary | ICD-10-CM

## 2022-11-05 DIAGNOSIS — R0683 Snoring: Secondary | ICD-10-CM | POA: Diagnosis not present

## 2022-11-05 LAB — POCT GLYCOSYLATED HEMOGLOBIN (HGB A1C): Hemoglobin A1C: 5.3 % (ref 4.0–5.6)

## 2022-11-05 LAB — POCT URINALYSIS DIPSTICK
Bilirubin, UA: NEGATIVE
Glucose, UA: NEGATIVE
Ketones, UA: NEGATIVE
Leukocytes, UA: NEGATIVE
Nitrite, UA: NEGATIVE
Protein, UA: NEGATIVE
Spec Grav, UA: 1.025 (ref 1.010–1.025)
Urobilinogen, UA: 0.2 E.U./dL
pH, UA: 6 (ref 5.0–8.0)

## 2022-11-05 LAB — GLUCOSE, CAPILLARY: Glucose-Capillary: 97 mg/dL (ref 70–99)

## 2022-11-05 MED ORDER — PANTOPRAZOLE SODIUM 40 MG PO TBEC: 40.0000 mg | DELAYED_RELEASE_TABLET | Freq: Every day | ORAL | 1 refills | Status: AC

## 2022-11-05 NOTE — Assessment & Plan Note (Signed)
Patient denies previous sleep study, diagnosis of OSA, or use of CPAP. Patient reports snoring at time and interruptions in her sleeping at night which may be secondary to her breathing. Her husband states that he may have seen her have difficulty breathing at night. Patient also reports daytime tiredness. Patient does not have a history of HTN. Age is 29. Gender is female. BMI is 33 ( not > 35). Neck circumference is 41 cm (> 40 cm). STOP-BANG score is 4 with intermediate risk. Will order sleep study to assess for OSA.   Plan: - Referral for sleep study ordered

## 2022-11-05 NOTE — Progress Notes (Signed)
CC: hospital f/u  HPI:  Brittany Perez is a 37 y.o. female with past medical history of nephrolithiasis, obesity and post-partum depression that presents for a hospital f/u visit.    Allergies as of 11/05/2022   No Known Allergies      Medication List        Accurate as of Nov 05, 2022 11:38 AM. If you have any questions, ask your nurse or doctor.          HYDROcodone Bit-Homatrop MBr 5-1.5 MG Tabs Take 1 tablet by mouth every 4 (four) hours as needed.   meclizine 25 MG tablet Commonly known as: ANTIVERT Take 1 tablet (25 mg total) by mouth 3 (three) times daily as needed for dizziness.   pantoprazole 40 MG tablet Commonly known as: Protonix Take 1 tablet (40 mg total) by mouth daily. Started by: Karoline Caldwell, MD   prenatal multivitamin Tabs tablet Take 1 tablet by mouth daily at 12 noon.         Past Medical History:  Diagnosis Date   Preterm labor    Review of Systems:  per HPI.   Physical Exam: Vitals:   11/05/22 1003  BP: 113/69  Pulse: 85  Temp: 98.2 F (36.8 C)  TempSrc: Oral  SpO2: 99%  Weight: 191 lb 4.8 oz (86.8 kg)  Height: 5\' 3"  (1.6 m)   Constitutional: Well-developed, well-nourished, appears comfortable  HENT: Normocephalic and atraumatic.  Eyes: EOM are normal. PERRL.  Neck: Normal range of motion.  Cardiovascular: Regular rate, regular rhythm. No murmurs, rubs, or gallops. Normal radial and PT pulses bilaterally. No LE edema.  Pulmonary: Normal respiratory effort. No wheezes, rales, or rhonchi.   Abdominal: Soft. Non-distended. No tenderness. Normal bowel sounds.  Musculoskeletal: Normal range of motion. No CVA tenderness.     Neurological: Alert and oriented to person, place, and time. Non-focal. Skin: warm and dry.    Assessment & Plan:   See Encounters Tab for problem based charting.  Nephrolithiasis Presented to ED on 5/8 for right back pain/abdominal pain. CT abd/pelvis demonstrated mild R hydronephrosis, urothelial  enhancement of the right ureter consistent w/ recently passed calculus, and 3 mm calculus in the bladder. Was not discharged on antibiotics w/ plan to f/u w/ urology. Symptoms have improved. Denies dysuria but has polyuria. Urine dip today demonstrates moderate blood but otherwise WNL. No prior A1c on file. Suspect polyuria may be secondary to hyperglycemia. Will obtain A1c today.   Plan: - Continue to monitor symptoms - A1c today  Snoring Patient denies previous sleep study, diagnosis of OSA, or use of CPAP. Patient reports snoring at time and interruptions in her sleeping at night which may be secondary to her breathing. Her husband states that he may have seen her have difficulty breathing at night. Patient also reports daytime tiredness. Patient does not have a history of HTN. Age is 89. Gender is female. BMI is 33 ( not > 35). Neck circumference is 41 cm (> 40 cm). STOP-BANG score is 4 with intermediate risk. Will order sleep study to assess for OSA.   Plan: - Referral for sleep study ordered  GERD (gastroesophageal reflux disease) Patient reports intermittent heart burn at home. Reports that her symptoms worsen with spicy food and with lying flat. Will start pantoprazole and reassess at next visit.   Plan: -Start pantoprazole 40 mg  Health care maintenance Ordered screening for Hep C today. Will need pap smear at next visit.   Plan: - Hep C screening  Patient seen with Dr. Antony Contras

## 2022-11-05 NOTE — Assessment & Plan Note (Signed)
Ordered screening for Hep C today. Will need pap smear at next visit.   Plan: - Hep C screening

## 2022-11-05 NOTE — Telephone Encounter (Signed)
Pa for pt (PANTOPRAZOLE SOD 40 MG DR TAB ) came through on cover my meds was submitted with last office notes . Awaiting approval or denial

## 2022-11-05 NOTE — Assessment & Plan Note (Signed)
Presented to ED on 5/8 for right back pain/abdominal pain. CT abd/pelvis demonstrated mild R hydronephrosis, urothelial enhancement of the right ureter consistent w/ recently passed calculus, and 3 mm calculus in the bladder. Was not discharged on antibiotics w/ plan to f/u w/ urology. Symptoms have improved. Denies dysuria but has polyuria. Urine dip today demonstrates moderate blood but otherwise WNL. No prior A1c on file. Suspect polyuria may be secondary to hyperglycemia. Will obtain A1c today.   Plan: - Continue to monitor symptoms - A1c today

## 2022-11-05 NOTE — Patient Instructions (Addendum)
Thank you for coming to see [place] in clinic Brittany Perez.  Plan:  - Please start taking:     - Pantoprazole 40 mg daily for your burning throat pain  - We got the following labs today and will call you with the results:     - A1c to check your blood sugar  - Hepatitis C screening  - We referred you to have a sleep study done to check for Obstructive Sleep Apnea (you will be called to schedule an appointment)   It was very nice to see you, thank you for allowing [place] to be involved in your care. We look forward to seeing you next time. Please call our clinic at [phone] if you have any questions or concerns. The best time to call is Monday-Friday from 9am-4pm, but there is someone available 24/7. If after hours or the weekend, call the main hospital number at [phone] and ask for the Internal Medicine Resident On-Call. If you need medication refills, please notify your pharmacy one week in advance and they will send [place] a request.   Please make sure to arrive 15 minutes prior to your next appointment. If you arrive late, you may be asked to reschedule.

## 2022-11-05 NOTE — Progress Notes (Signed)
Internal Medicine Clinic Attending  Case discussed with Dr. Mapp  At the time of the visit.  We reviewed the resident's history and exam and pertinent patient test results.  I agree with the assessment, diagnosis, and plan of care documented in the resident's note.  

## 2022-11-05 NOTE — Assessment & Plan Note (Signed)
Patient reports intermittent heart burn at home. Reports that her symptoms worsen with spicy food and with lying flat. Will start pantoprazole and reassess at next visit.   Plan: -Start pantoprazole 40 mg

## 2022-11-05 NOTE — Telephone Encounter (Signed)
DECISION :     Approved today   Approved. Authorization Expiration Date: 11/05/2023   Drug Pantoprazole Sodium 40MG  dr tablets   Form [employer] [place] [employer] Electronic    Request Form Original Claim Info 75 DRUG REQUIRES PRIOR AUTHORIZATION          ( COPY SENT TO PHARMACY AND PLACED TO SCAN TO CHART )

## 2022-11-06 LAB — HCV AB W REFLEX TO QUANT PCR: HCV Ab: NONREACTIVE

## 2022-11-06 LAB — HCV INTERPRETATION

## 2023-09-10 DIAGNOSIS — Z0001 Encounter for general adult medical examination with abnormal findings: Secondary | ICD-10-CM | POA: Diagnosis not present

## 2023-09-10 DIAGNOSIS — Z136 Encounter for screening for cardiovascular disorders: Secondary | ICD-10-CM | POA: Diagnosis not present

## 2023-09-10 DIAGNOSIS — M19142 Post-traumatic osteoarthritis, left hand: Secondary | ICD-10-CM | POA: Diagnosis not present

## 2023-09-10 DIAGNOSIS — Z131 Encounter for screening for diabetes mellitus: Secondary | ICD-10-CM | POA: Diagnosis not present

## 2023-09-10 DIAGNOSIS — R030 Elevated blood-pressure reading, without diagnosis of hypertension: Secondary | ICD-10-CM | POA: Diagnosis not present

## 2023-09-24 DIAGNOSIS — E782 Mixed hyperlipidemia: Secondary | ICD-10-CM | POA: Diagnosis not present

## 2023-09-24 DIAGNOSIS — R7401 Elevation of levels of liver transaminase levels: Secondary | ICD-10-CM | POA: Diagnosis not present

## 2023-09-24 DIAGNOSIS — R7303 Prediabetes: Secondary | ICD-10-CM | POA: Diagnosis not present

## 2023-11-26 DIAGNOSIS — E782 Mixed hyperlipidemia: Secondary | ICD-10-CM | POA: Diagnosis not present

## 2023-11-26 DIAGNOSIS — R7401 Elevation of levels of liver transaminase levels: Secondary | ICD-10-CM | POA: Diagnosis not present

## 2023-11-26 DIAGNOSIS — M19142 Post-traumatic osteoarthritis, left hand: Secondary | ICD-10-CM | POA: Diagnosis not present

## 2023-11-26 DIAGNOSIS — R7303 Prediabetes: Secondary | ICD-10-CM | POA: Diagnosis not present

## 2023-12-12 DIAGNOSIS — R7303 Prediabetes: Secondary | ICD-10-CM | POA: Diagnosis not present

## 2023-12-12 DIAGNOSIS — E782 Mixed hyperlipidemia: Secondary | ICD-10-CM | POA: Diagnosis not present

## 2023-12-12 DIAGNOSIS — R7401 Elevation of levels of liver transaminase levels: Secondary | ICD-10-CM | POA: Diagnosis not present

## 2023-12-31 ENCOUNTER — Encounter: Admitting: Student

## 2024-01-21 DIAGNOSIS — R7303 Prediabetes: Secondary | ICD-10-CM | POA: Diagnosis not present

## 2024-01-21 DIAGNOSIS — M19142 Post-traumatic osteoarthritis, left hand: Secondary | ICD-10-CM | POA: Diagnosis not present

## 2024-01-21 DIAGNOSIS — E782 Mixed hyperlipidemia: Secondary | ICD-10-CM | POA: Diagnosis not present

## 2024-05-01 DIAGNOSIS — L309 Dermatitis, unspecified: Secondary | ICD-10-CM | POA: Diagnosis not present

## 2024-05-01 DIAGNOSIS — R7303 Prediabetes: Secondary | ICD-10-CM | POA: Diagnosis not present

## 2024-05-01 DIAGNOSIS — E782 Mixed hyperlipidemia: Secondary | ICD-10-CM | POA: Diagnosis not present

## 2024-05-01 DIAGNOSIS — R7401 Elevation of levels of liver transaminase levels: Secondary | ICD-10-CM | POA: Diagnosis not present

## 2024-05-15 ENCOUNTER — Other Ambulatory Visit: Payer: Self-pay | Admitting: Physician Assistant

## 2024-05-15 DIAGNOSIS — R7303 Prediabetes: Secondary | ICD-10-CM | POA: Diagnosis not present

## 2024-05-15 DIAGNOSIS — Z6837 Body mass index (BMI) 37.0-37.9, adult: Secondary | ICD-10-CM | POA: Diagnosis not present

## 2024-05-15 DIAGNOSIS — Z1231 Encounter for screening mammogram for malignant neoplasm of breast: Secondary | ICD-10-CM

## 2024-05-15 DIAGNOSIS — E782 Mixed hyperlipidemia: Secondary | ICD-10-CM | POA: Diagnosis not present
